# Patient Record
Sex: Male | Born: 1945 | Race: White | Hispanic: No | Smoking: Current every day smoker
Health system: Southern US, Community
[De-identification: ages and names within clinical notes are randomized; demographics above are authoritative.]

## PROBLEM LIST (undated history)

## (undated) DIAGNOSIS — I10 Essential (primary) hypertension: Secondary | ICD-10-CM

## (undated) DIAGNOSIS — I2510 Atherosclerotic heart disease of native coronary artery without angina pectoris: Secondary | ICD-10-CM

## (undated) DIAGNOSIS — E785 Hyperlipidemia, unspecified: Secondary | ICD-10-CM

## (undated) DIAGNOSIS — Z720 Tobacco use: Secondary | ICD-10-CM

## (undated) DIAGNOSIS — N529 Male erectile dysfunction, unspecified: Secondary | ICD-10-CM

## (undated) DIAGNOSIS — M1990 Unspecified osteoarthritis, unspecified site: Secondary | ICD-10-CM

## (undated) HISTORY — DX: Hyperlipidemia, unspecified: E78.5

## (undated) HISTORY — PX: CARDIAC CATHETERIZATION: SHX172

## (undated) HISTORY — DX: Essential (primary) hypertension: I10

## (undated) HISTORY — DX: Male erectile dysfunction, unspecified: N52.9

## (undated) HISTORY — PX: SKIN LESION EXCISION: SHX2412

## (undated) HISTORY — PX: CIRCUMCISION: SUR203

## (undated) HISTORY — DX: Tobacco use: Z72.0

## (undated) HISTORY — DX: Atherosclerotic heart disease of native coronary artery without angina pectoris: I25.10

---

## 2003-01-30 ENCOUNTER — Ambulatory Visit (HOSPITAL_COMMUNITY): Admission: RE | Admit: 2003-01-30 | Discharge: 2003-01-30 | Payer: Self-pay | Admitting: *Deleted

## 2006-05-31 ENCOUNTER — Ambulatory Visit: Payer: Self-pay | Admitting: Cardiology

## 2006-06-01 ENCOUNTER — Inpatient Hospital Stay (HOSPITAL_COMMUNITY): Admission: AD | Admit: 2006-06-01 | Discharge: 2006-06-02 | Payer: Self-pay | Admitting: Cardiology

## 2006-06-16 ENCOUNTER — Ambulatory Visit: Payer: Self-pay | Admitting: Cardiology

## 2006-06-17 ENCOUNTER — Encounter (INDEPENDENT_AMBULATORY_CARE_PROVIDER_SITE_OTHER): Payer: Self-pay | Admitting: *Deleted

## 2006-06-17 LAB — CONVERTED CEMR LAB
Albumin: 4.4 g/dL
CO2: 24 meq/L
Chloride: 103 meq/L
HDL: 41 mg/dL
Sodium: 142 meq/L
Total Protein: 7.4 g/dL

## 2007-01-17 ENCOUNTER — Observation Stay (HOSPITAL_COMMUNITY): Admission: AD | Admit: 2007-01-17 | Discharge: 2007-01-19 | Payer: Self-pay | Admitting: Family Medicine

## 2007-01-17 ENCOUNTER — Ambulatory Visit: Payer: Self-pay | Admitting: Cardiology

## 2007-01-19 ENCOUNTER — Ambulatory Visit: Payer: Self-pay | Admitting: Cardiology

## 2008-07-20 ENCOUNTER — Ambulatory Visit: Payer: Self-pay | Admitting: Internal Medicine

## 2008-07-20 ENCOUNTER — Encounter: Payer: Self-pay | Admitting: Emergency Medicine

## 2008-07-20 ENCOUNTER — Encounter (INDEPENDENT_AMBULATORY_CARE_PROVIDER_SITE_OTHER): Payer: Self-pay | Admitting: *Deleted

## 2008-07-20 ENCOUNTER — Inpatient Hospital Stay (HOSPITAL_COMMUNITY): Admission: AD | Admit: 2008-07-20 | Discharge: 2008-07-23 | Payer: Self-pay | Admitting: Internal Medicine

## 2008-07-22 ENCOUNTER — Encounter: Payer: Self-pay | Admitting: Cardiology

## 2008-08-07 ENCOUNTER — Ambulatory Visit: Payer: Self-pay | Admitting: Cardiology

## 2008-08-21 ENCOUNTER — Ambulatory Visit: Payer: Self-pay | Admitting: Cardiology

## 2008-09-04 ENCOUNTER — Encounter: Payer: Self-pay | Admitting: Cardiology

## 2008-09-04 LAB — CONVERTED CEMR LAB
ALT: 17 units/L
AST: 18 units/L
BUN: 6 mg/dL
CO2: 25 meq/L
Calcium: 8.5 mg/dL
Chloride: 101 meq/L
HCT: 43 %
HDL: 47 mg/dL
Hemoglobin: 14.4 g/dL
Total CK: 87 units/L
Triglyceride fasting, serum: 216 mg/dL

## 2008-10-14 IMAGING — NM NM MYOCAR PERF WALL MOTION
2 series · 12 of 12 positions shown · non-contrast
Comparison: none

CLINICAL DATA: 60-year-old gentleman with a history of coronary disease admitted to the hospital with recurrent chest pain.
 ADENOSINE MYOVIEW STUDY:
 Radionuclide Data:  One day rest/stress protocol performed with [DATE] mCi Nc-44m Myoview.
 Stress Data:  Treadmill exercised to workload of  7 mets and a heart rate of 143, 89% of age-predicted maximum.  Exercise discontinued due to dyspnea and leg fatigue; no chest discomfort reported.  Blood pressure increased from a resting value of 145/75 to 170/80 during exercise and 200/72 early in recovery, a borderline response.  No arrhythmia is noted.  
 EKG:  Normal sinus rhythm; left atrial abnormality; right ventricular conduction delay; prior inferior myocardial infarction; minor nonspecific ST-T wave abnormality.  
 Stress EKG:  During exercise; borderline very slowly upsloping ST segment depression noted late in recovery. 
 Scintigraphic Data:  Acquisition notable for mild to moderate diaphragmatic attenuation.  Left ventricular size was normal.  On tomographic images reconstructed in standard planes, there was a small to moderate sized portion of the basilar inferior and inferolateral segments with moderately to markedly decreased tracer uptake.  By comparison to the resting portion of the study, minimal reversibility was noted.  The gated reconstruction demonstrated moderate to marked hypokinesis at the base of the inferior wall; overall function was low normal with an estimated ejection fraction of .51.  There was decreased systolic accentuation of activity in the basilar inferior segment.

[Series 1: cs cardiac tc hi dose · 6.52mm/px · 6 of 512 frames shown]
[frame 43/512]
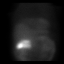
[frame 128/512]
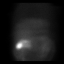
[frame 214/512]
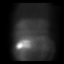
[frame 299/512]
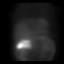
[frame 384/512]
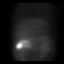
[frame 470/512]
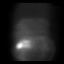

[Series 1: cr cardiac tc low dose · 6.52mm/px · 6 of 64 frames shown]
[frame 6/64]
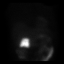
[frame 16/64]
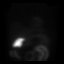
[frame 27/64]
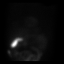
[frame 38/64]
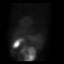
[frame 48/64]
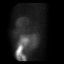
[frame 59/64]
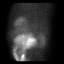

[12 of 12 positions shown; findings below may reference images not displayed]

IMPRESSION: Abnormal stress Myoview examination revealing impaired exercise capacity, and essentially negative stress EKG, no exercise-induced angina, normal left ventricular size and borderline left ventricular systolic function with a segmental wall motion abnormality as described.  By scintigraphic imaging, there was inferior infarction with minimal periinfarction ischemia.  Other findings as noted.

## 2008-11-04 ENCOUNTER — Encounter: Payer: Self-pay | Admitting: Cardiology

## 2008-11-04 DIAGNOSIS — M109 Gout, unspecified: Secondary | ICD-10-CM | POA: Insufficient documentation

## 2008-11-05 ENCOUNTER — Ambulatory Visit: Payer: Self-pay | Admitting: Cardiology

## 2008-11-28 ENCOUNTER — Ambulatory Visit: Payer: Self-pay | Admitting: Cardiology

## 2008-12-23 ENCOUNTER — Encounter: Payer: Self-pay | Admitting: Cardiology

## 2009-03-13 ENCOUNTER — Ambulatory Visit: Payer: Self-pay | Admitting: Cardiology

## 2009-05-16 ENCOUNTER — Encounter (INDEPENDENT_AMBULATORY_CARE_PROVIDER_SITE_OTHER): Payer: Self-pay | Admitting: *Deleted

## 2009-07-08 ENCOUNTER — Encounter (INDEPENDENT_AMBULATORY_CARE_PROVIDER_SITE_OTHER): Payer: Self-pay | Admitting: *Deleted

## 2009-07-21 ENCOUNTER — Ambulatory Visit: Payer: Self-pay | Admitting: Cardiology

## 2009-07-21 ENCOUNTER — Encounter: Payer: Self-pay | Admitting: Cardiology

## 2009-08-19 ENCOUNTER — Ambulatory Visit: Payer: Self-pay | Admitting: Cardiology

## 2009-08-19 ENCOUNTER — Encounter (INDEPENDENT_AMBULATORY_CARE_PROVIDER_SITE_OTHER): Payer: Self-pay | Admitting: *Deleted

## 2009-09-09 ENCOUNTER — Encounter (INDEPENDENT_AMBULATORY_CARE_PROVIDER_SITE_OTHER): Payer: Self-pay | Admitting: *Deleted

## 2009-09-19 ENCOUNTER — Ambulatory Visit: Payer: Self-pay | Admitting: Cardiovascular Disease

## 2009-09-19 ENCOUNTER — Encounter: Payer: Self-pay | Admitting: Cardiology

## 2009-09-19 LAB — CONVERTED CEMR LAB
ALT: 16 units/L (ref 0–53)
AST: 16 units/L (ref 0–37)
Albumin: 4.3 g/dL (ref 3.5–5.2)
Alkaline Phosphatase: 50 units/L (ref 39–117)
LDL Cholesterol: 56 mg/dL (ref 0–99)
Potassium: 4.4 meq/L (ref 3.5–5.3)
Sodium: 139 meq/L (ref 135–145)
Total Protein: 6.6 g/dL (ref 6.0–8.3)

## 2009-09-23 ENCOUNTER — Encounter (INDEPENDENT_AMBULATORY_CARE_PROVIDER_SITE_OTHER): Payer: Self-pay | Admitting: *Deleted

## 2009-10-21 ENCOUNTER — Ambulatory Visit: Payer: Self-pay | Admitting: Cardiology

## 2009-10-27 ENCOUNTER — Encounter: Payer: Self-pay | Admitting: Cardiology

## 2009-10-27 LAB — CONVERTED CEMR LAB
CO2: 23 meq/L (ref 19–32)
Calcium: 9.5 mg/dL (ref 8.4–10.5)
Chloride: 102 meq/L (ref 96–112)
Sodium: 137 meq/L (ref 135–145)

## 2010-01-26 ENCOUNTER — Telehealth (INDEPENDENT_AMBULATORY_CARE_PROVIDER_SITE_OTHER): Payer: Self-pay | Admitting: *Deleted

## 2010-04-15 IMAGING — CR DG CHEST 1V PORT
1 series · 1 of 1 positions shown · non-contrast
Comparison: 01/18/2007

CLINICAL DATA: Chest pain

PORTABLE CHEST - 1 VIEW

[view not recorded]
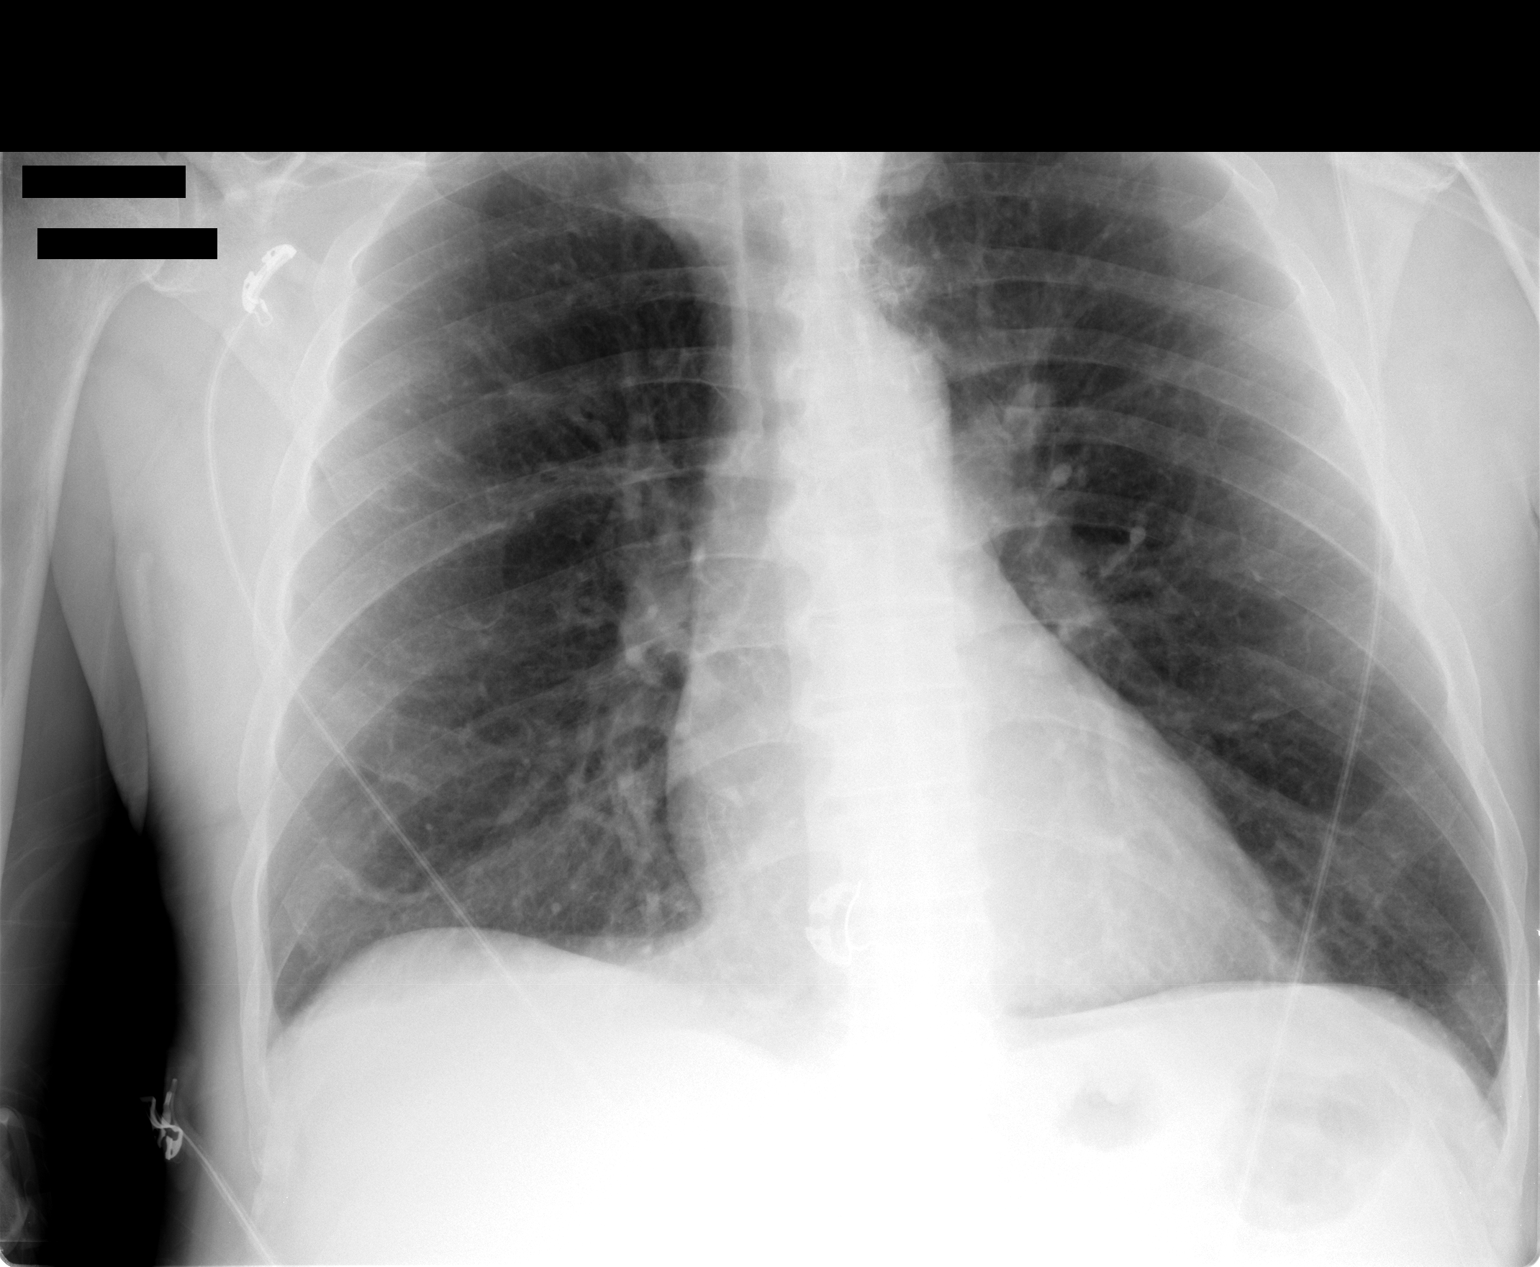

[1 of 1 positions shown; findings below may reference images not displayed]

FINDINGS: Cardiomediastinal silhouette is within normal limits. The
lungs are clear. No pleural effusion.  No pneumothorax.  No acute
osseous abnormality.
IMPRESSION: No acute cardiopulmonary process.

## 2010-07-14 NOTE — Progress Notes (Signed)
Summary: pt wants to know about Zocor  Phone Note Call from Patient Call back at Home Phone [phone]   Caller: pt Reason for Call: Talk to Nurse Summary of Call: Pt is calling to find what Zocor is used for and if they could get it called in if it would be cheeper then what meds he is on now. Initial call taken by: Faythe Ghee,  January 26, 2010 1:45 PM  Follow-up for Phone Call        there is no generic equilavent for the amount of lipitor Mr. Winegarden is on, he is on an 80mg  dose Follow-up by: Teressa Lower RN,  January 28, 2010 10:02 AM

## 2010-07-14 NOTE — Assessment & Plan Note (Signed)
Summary: E4V   Visit Type:  Follow-up Primary Provider:  Dr. Butch Penny   History of Present Illness: Mr. Allard Lightsey returns to the office as scheduled for continued assessment and treatment of coronary disease and cardiovascular risk factors.  Since his last visit, he has done quite well.  He is working full-time in a [employer] in a fairly physically demanding job without any cardiopulmonary symptoms.  Unfortunately, he continues to smoke cigarettes, albeit at a reduced rate of one half pack per day.  He quit Chantix after 2 weeks as a result of adverse GI effects.  He has not had a recent assessment of cholesterol.  Blood pressure has been good as far as he knows.  He experiences sinus congestion, which causes a vague sense of dyspnea.  He has never been hospitalized for chronic lung disease.   Current Medications (verified): 1)  Lipitor 80 Mg Tabs (Atorvastatin Calcium) .... Take One Tablet By Mouth Daily. 2)  Aspirin 81 Mg Tbec (Aspirin) .... Take One Tablet By Mouth Daily 3)  Plavix 75 Mg Tabs (Clopidogrel Bisulfate) .... Take One Tablet By Mouth Daily  Allergies (verified): No Known Drug Allergies  Past History:  PMH, FH, and Social History reviewed and updated.  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    Vital Signs:  Patient profile:    65 year old male Weight:      155 pounds BMI:     22.97 Pulse rate:   78 / minute BP sitting:   156 / 76  (right arm)  Vitals Entered By: Dreama Saa, CNA (August 19, 2009 11:45 AM)  Physical Exam  General:   Proportionate weight and height; well developed; no acute distress:   Neck-No JVD; no carotid bruits: Lungs-No tachypnea, no rales; no rhonchi; no wheezes; some prolongation of the expiratory phase Cardiovascular-normal PMI; normal S1 and S2; S4 present Abdomen-BS normal; soft and non-tender without masses or organomegaly:  Musculoskeletal-No deformities, no cyanosis or clubbing: Neurologic-Normal cranial nerves; symmetric strength and tone:  Skin-Warm, no significant lesions: Extremities-Nl distal pulses; trace edema:     Impression & Recommendations:  Problem # 1:  HYPERLIPIDEMIA (ICD-272.4) Lipid profile was excellent one year ago.  A repeat lipid profile and complete metabolic profile will be obtained.  Problem # 2:  TOBACCO ABUSE (ICD-305.1) Patient is committed to attempting to taper tobacco consumption, but is not interested in an alternative pharmacologic agent.  I encouraged him to continue to work on attempts to abstain from tobacco use entirely.  Problem # 3:  ATHEROSCLEROTIC CARDIOVASCULAR DISEASE (ICD-429.2) No symptoms to suggest myocardial ischemia.  Plavix has been used for one year following drug-eluting stent placement.  We will continue that agent for at least one more year.  I will reassess this nice gentleman in 12 months.  Other Orders: Future Orders: T-Lipid Profile ([id number]) ... 09/19/2009 T-Comprehensive Metabolic Panel [id number]) ... 09/19/2009  Patient Instructions: 1)  Your physician recommends that you schedule a follow-up appointment in: 1 year  2)  Your physician recommends that you return for lab work in: 1 month

## 2010-07-14 NOTE — Letter (Signed)
Summary: [place] [facility] Work Engineer, agricultural at [employer]  [address] [place] [address]   Phone: [phone]  Fax: [phone]     August 19, 2009 MRN: [id number]   [facility] [address] [place], [place]  [address]      YOUR LAB WORK IS DUE   _____________APRIL 8, 2011____________________________  Please go to [facility], located across the street from [facility] on the second floor.  Hours are Monday - Friday 7am until 7:30pm         Saturday 8am until 12noon    _X_  DO NOT EAT OR DRINK AFTER MIDNIGHT EVENING PRIOR TO LABWORK  __ YOUR LABWORK IS NOT FASTING --YOU MAY EAT PRIOR TO LABWORK

## 2010-07-14 NOTE — Miscellaneous (Signed)
  Clinical Lists Changes  Observations: Added new observation of RS STUDY: TRACER - study completion 07/22/09 (09/09/2009 12:09)      Research Study Name: TRACER - study completion 07/22/09

## 2010-07-14 NOTE — Miscellaneous (Signed)
Summary: LABS TSH 07/20/2008 CMP,LIPIDS 06/17/2006  Clinical Lists Changes  Observations: Added new observation of TSH: 0.653 microintl units/mL (07/20/2008 11:19)

## 2010-07-14 NOTE — Assessment & Plan Note (Signed)
Summary: 1 MTH NURSE VISIT PER CHECKOUT ON 08/19/09/TG  Nurse Visit   Vital Signs:  Patient profile:   65 year old male Height:      69 inches Weight:      156 pounds O2 Sat:      98 % on Room air Pulse rate:   73 / minute BP sitting:   161 / 92  (right arm)  Vitals Entered By: Teressa Lower RN (September 19, 2009 9:55 AM)  O2 Flow:  Room air  Visit Type:  1 month nurse visit Primary Provider:  Dr. Butch Penny   History of Present Illness: S:1 month nurse visit B: pt did not do a bp diary A, stated he would check it but no write it down, average per pt was 140/80, high he seen was 157/82, does not take any bp medication R  asked pt to take his bp daily over the weekend and bring reading back on Monday  Dr. Molly Maduro Rothbart-add lisinopril/HCT 20/12.5 mg q.d.;                                  Bmet in one month                                  RN blood pressure visit in one month.    Current Medications (verified): 1)  Lipitor 80 Mg Tabs (Atorvastatin Calcium) .... Take One Tablet By Mouth Daily. 2)  Aspirin 81 Mg Tbec (Aspirin) .... Take One Tablet By Mouth Daily 3)  Plavix 75 Mg Tabs (Clopidogrel Bisulfate) .... Take One Tablet By Mouth Daily  Allergies (verified): No Known Drug Allergies  Orders Added: 1)  T-Basic Metabolic Panel [id number] Prescriptions: LISINOPRIL-HYDROCHLOROTHIAZIDE 20-12.5 MG TABS (LISINOPRIL-HYDROCHLOROTHIAZIDE) Take 1 tablet by mouth once a day  #30 x 3   Entered by:   Teressa Lower RN   Authorized by:   Kathlen Brunswick, MD, [facility]   Signed by:   Teressa Lower RN on 09/23/2009   Method used:   Electronically to        [facility] (retail)       [address]       [place], [place]  [address]       Ph: [phone]       Fax: [phone]   RxID:   [id number]

## 2010-07-14 NOTE — Letter (Signed)
Summary: bp readings  bp readings   Imported By: Faythe Ghee 10/21/2009 16:01:02  _____________________________________________________________________  External Attachment:    Type:   Image     Comment:   External Document

## 2010-07-14 NOTE — Letter (Signed)
Summary: [place] [facility] Work Engineer, agricultural at [employer]  [address] [place] [address]   Phone: [phone]  Fax: [phone]     Oct 27, 2009 MRN: [id number]   [name] [address] [place] [address] [place]  [address]      YOUR LAB WORK IS DUE  November 17, 2009 _________________________________________  Please go to [facility], located across the street from [facility] on the second floor.  Hours are Monday - Friday 7am until 7:30pm         Saturday 8am until 12noon    __  DO NOT EAT OR DRINK AFTER MIDNIGHT EVENING PRIOR TO LABWORK  _X_ YOUR LABWORK IS NOT FASTING --YOU MAY EAT PRIOR TO LABWORK

## 2010-07-14 NOTE — Letter (Signed)
Summary: Handout Printed  Printed Handout:  - Diet - Potassium Content of Foods  Appended Document: Orders Update: BMET    Clinical Lists Changes  Orders: Added new Test order of T-Basic Metabolic Panel [id number]) - Signed

## 2010-07-14 NOTE — Assessment & Plan Note (Signed)
Summary: 1 mth nurse visit per Tammy/tg  Nurse Visit   Vital Signs:  Patient profile:   65 year old male Weight:      154 pounds O2 Sat:      97 % Pulse rate:   78 / minute BP sitting:   150 / 81  (left arm)  Vitals Entered ByLarita Fife Via LPN (Oct 21, 2009 10:47 AM)  Current Medications (verified): 1)  Lipitor 80 Mg Tabs (Atorvastatin Calcium) .... Take One Tablet By Mouth Daily. 2)  Aspirin 81 Mg Tbec (Aspirin) .... Take One Tablet By Mouth Daily 3)  Plavix 75 Mg Tabs (Clopidogrel Bisulfate) .... Take One Tablet By Mouth Daily 4)  Lisinopril-Hydrochlorothiazide 20-12.5 Mg Tabs (Lisinopril-Hydrochlorothiazide) .... Take 1 Tablet By Mouth Once A Day  Allergies (verified): No Known Drug Allergies  Visit Type:  BP check/Nurse visit Primary Provider:  Dr. Butch Penny   History of Present Illness: S: Pt. returns to office for BP check.  B: Pt. was seen on 09-19-09 for a BP check. BP at that visit was 161/92.  Dr. Marvel Plan recommendations were to start Lisinopril/HCT 20/12.5mg  once daily , have BMET in 1 month and BP/nurse visit.  A: Pt. brought in list of BP readings that he took on himself when he could, scanned into chart. He did not write date or time of each reading on list. Out of 9 readings his SBP Avg.= 123 and the DBP Avg.=64. BP this morning is higher  @ 150/81. He reports no complaints at this time. Was given lab order to have BMET drawn this morning. R: Will call pt. with Dr. Langston Masker recommendations, if any, from this BP check.         10/23/09  Continue current meds; F/U as planned.   [name], M.D.                                                                                                                                                                           Patient advised.         Larita Fife Via LPN  Oct 23, 2009 1:31 PM

## 2010-07-14 NOTE — Miscellaneous (Signed)
Summary: LABS CMP,LIPIDS 06/17/2006  Clinical Lists Changes  Observations: Added new observation of CALCIUM: 9.5 mg/dL (16/03/9603 54:09) Added new observation of ALBUMIN: 4.4 g/dL (81/19/1478 29:56) Added new observation of PROTEIN, TOT: 7.4 g/dL (21/30/8657 84:69) Added new observation of SGPT (ALT): 63 units/L (06/17/2006 11:54) Added new observation of SGOT (AST): 33 units/L (06/17/2006 11:54) Added new observation of ALK PHOS: 52 units/L (06/17/2006 11:54) Added new observation of CREATININE: 0.81 mg/dL (62/95/2841 32:44) Added new observation of BUN: 11 mg/dL (06/16/7251 66:44) Added new observation of BG RANDOM: 93 mg/dL (03/47/4259 56:38) Added new observation of CO2 PLSM/SER: 24 meq/L (06/17/2006 11:54) Added new observation of CL SERUM: 103 meq/L (06/17/2006 11:54) Added new observation of K SERUM: 5.0 meq/L (06/17/2006 11:54) Added new observation of NA: 142 meq/L (06/17/2006 11:54) Added new observation of HDL: 41 mg/dL (75/64/3329 51:88) Added new observation of TRIGLYC TOT: 454 mg/dL (41/66/0630 16:01) Added new observation of CHOLESTEROL: 224 mg/dL (09/32/3557 32:20)

## 2010-07-14 NOTE — Letter (Signed)
Summary: [place] Results Engineer, agricultural at [facility]  [address] [place] [address]   Phone: [phone]  Fax: [phone]      September 23, 2009 MRN: [id number]   MARTINEZ BOXX [address] [place] [address], [place]  [address]   Dear Mr. Kundrat,  Your test ordered by Selena Batten has been reviewed by your physician (or physician assistant) and was found to be normal or stable. Your physician (or physician assistant) felt no changes were needed at this time.  ____ Echocardiogram  ____ Cardiac Stress Test  __x__ Lab Work  ____ Peripheral vascular study of arms, legs or neck  ____ CT scan or X-ray  ____ Lung or Breathing test  ____ Other:  Please start lisinopril/hct 20/12.5mg  daily, labwork in 1 month. Nurse visit for bp check and please bring  a list of your medications and your bp dairy, per Dr. Dietrich Pates.  Thank you, Shaylan Tutton Allyne Gee RN    Hastings [name], MD, [facility] Arena.C.Gaylord Shih, MD, F.A.C.C Lewayne Bunting, MD, F.A.C.C Nona Dell, MD, F.A.C.C Charlton Haws, MD, [facility] Arena.C.C

## 2010-09-28 ENCOUNTER — Encounter: Payer: Self-pay | Admitting: *Deleted

## 2010-09-28 ENCOUNTER — Encounter: Payer: Self-pay | Admitting: Cardiology

## 2010-09-28 ENCOUNTER — Ambulatory Visit (INDEPENDENT_AMBULATORY_CARE_PROVIDER_SITE_OTHER): Payer: BC Managed Care – PPO | Admitting: Cardiology

## 2010-09-28 VITALS — BP 108/65 | HR 83 | Ht 69.0 in | Wt 148.0 lb

## 2010-09-28 DIAGNOSIS — I2510 Atherosclerotic heart disease of native coronary artery without angina pectoris: Secondary | ICD-10-CM

## 2010-09-28 DIAGNOSIS — E785 Hyperlipidemia, unspecified: Secondary | ICD-10-CM

## 2010-09-28 DIAGNOSIS — N529 Male erectile dysfunction, unspecified: Secondary | ICD-10-CM | POA: Insufficient documentation

## 2010-09-28 DIAGNOSIS — F17200 Nicotine dependence, unspecified, uncomplicated: Secondary | ICD-10-CM

## 2010-09-28 MED ORDER — VARENICLINE TARTRATE 1 MG PO TABS: 1.0000 mg | ORAL_TABLET | Freq: Two times a day (BID) | ORAL | Status: DC

## 2010-09-28 MED ORDER — SILDENAFIL CITRATE 50 MG PO TABS: 50.0000 mg | ORAL_TABLET | Freq: Every day | ORAL | Status: DC | PRN

## 2010-09-28 MED ORDER — VARENICLINE TARTRATE 0.5 MG X 11 & 1 MG X 42 PO MISC: ORAL | Status: DC

## 2010-09-28 NOTE — Assessment & Plan Note (Signed)
Lipid profile one year ago was good except for elevated triglycerides.  A repeat study will be obtained.

## 2010-09-28 NOTE — Assessment & Plan Note (Signed)
Teaching was provided regarding the use of PDE inhibitors and a prescription for Viagra provided.

## 2010-09-28 NOTE — Progress Notes (Signed)
HPI : [name] returns to the office for continued assessment and treatment of coronary disease and cardiovascular risk factors.  He has done well over the past year with no chest discomfort, dyspnea, orthopnea, PND or syncope.  He continues to work full-time, noting more difficulty completing a 12 hour shift during which he is standing much of the time, but generally tolerating the work without any significant problem.  He has developed erectile dysfunction and requests a trial of Viagra.  He continues to smoke cigarettes, but has resolved to discontinue tobacco consumption.  Current Outpatient Prescriptions on File Prior to Visit  Medication Sig Dispense Refill  . aspirin 81 MG tablet Take 81 mg by mouth daily.        Marland Kitchen atorvastatin (LIPITOR) 80 MG tablet Take 80 mg by mouth daily.        . clopidogrel (PLAVIX) 75 MG tablet Take 75 mg by mouth daily.        Marland Kitchen lisinopril-hydrochlorothiazide (PRINZIDE,ZESTORETIC) 20-12.5 MG per tablet Take 1 tablet by mouth daily.           No Known Allergies    Past medical history, social history, and family history reviewed and updated.  ROS: Denies orthopnea, PND, lightheadedness, syncope and pedal edema.  PHYSICAL EXAM: BP 108/65  Pulse 83  Ht 5\' 9"  (1.753 m)  Wt 148 lb (67.132 kg)  BMI 21.86 kg/m2  SpO2 98%  General-Well developed; no acute distress Body habitus-proportionate weight and height Neck-No JVD; no carotid bruits Lungs-clear lung fields; resonant to percussion; mildly prolonged expiratory phase Cardiovascular-distant PMI; normal S1 and S2; modest systolic ejection murmur Abdomen-normal bowel sounds; soft and non-tender without masses or organomegaly Musculoskeletal-No deformities, no cyanosis or clubbing Neurologic-Normal cranial nerves; symmetric strength and tone Skin-Warm, no significant lesions Extremities-distal pulses intact; no edema  ASSESSMENT AND PLAN:

## 2010-09-28 NOTE — Assessment & Plan Note (Signed)
Patient was strongly encouraged to discontinue cigarette smoking.  Treatment with the Chantix will be initiated.

## 2010-09-28 NOTE — Assessment & Plan Note (Signed)
No clinical evidence for regression of coronary disease.  Our focus will remain the optimal control of risk factors.  It has been close to 2 years since a drug-eluting stent was placed for restenosis in a BMS.  Since the original procedure was performed for acute MI, a case could be made for lifelong treatment with a P2Y12 receptor blocker; however, I would be inclined to discontinue clopidogrel at his next visit.

## 2010-09-28 NOTE — Patient Instructions (Signed)
Your physician recommends that you schedule a follow-up appointment in: 1 YEAR Your physician recommends that you return for lab work in: [employer] Your physician has recommended you make the following change in your medication: ALEVE 1-2 TABLETS THREE TIMES DAILY AS NEEDED FOR PAIN, CHANTIX STARTER PAK FOLLOW DIRECTIONS, VIAGRA 1 TABLET DAILY AS NEEDED FOR SEXUAL ACTIVITY

## 2010-09-29 LAB — CBC
HCT: 38.7 % — ABNORMAL LOW (ref 39.0–52.0)
Hemoglobin: 15.7 g/dL (ref 13.0–17.0)
Hemoglobin: 13.5 g/dL (ref 13.0–17.0)
Hemoglobin: 13.8 g/dL (ref 13.0–17.0)
MCHC: 34.3 g/dL (ref 30.0–36.0)
MCHC: 34.9 g/dL (ref 30.0–36.0)
RBC: 4.81 MIL/uL (ref 4.22–5.81)
RBC: 4.12 MIL/uL — ABNORMAL LOW (ref 4.22–5.81)
RDW: 13.7 % (ref 11.5–15.5)
RDW: 13.8 % (ref 11.5–15.5)
WBC: 5.3 10*3/uL (ref 4.0–10.5)

## 2010-09-29 LAB — BASIC METABOLIC PANEL
CO2: 26 mEq/L (ref 19–32)
CO2: 25 mEq/L (ref 19–32)
Calcium: 8.9 mg/dL (ref 8.4–10.5)
Chloride: 101 mEq/L (ref 96–112)
GFR calc Af Amer: >60 mL/min (ref >60)
GFR calc Af Amer: >60 mL/min (ref >60)
GFR calc non Af Amer: >60 mL/min (ref >60)
Potassium: 3.5 mEq/L (ref 3.5–5.1)
Sodium: 139 mEq/L (ref 135–145)
Sodium: 134 mEq/L — ABNORMAL LOW (ref 135–145)

## 2010-09-29 LAB — DIFFERENTIAL
Lymphocytes Relative: 21 % (ref 12–46)
Lymphs Abs: 1.1 10*3/uL (ref 0.7–4.0)
Monocytes Absolute: 0.4 10*3/uL (ref 0.1–1.0)
Monocytes Relative: 8 % (ref 3–12)
Neutro Abs: 3.5 10*3/uL (ref 1.7–7.7)

## 2010-09-29 LAB — CARDIAC PANEL(CRET KIN+CKTOT+MB+TROPI)
CK, MB: 11.1 ng/mL — ABNORMAL HIGH (ref 0.3–4.0)
CK, MB: 13.1 ng/mL — ABNORMAL HIGH (ref 0.3–4.0)
Relative Index: 1.9 (ref 0.0–2.5)
Relative Index: INVALID (ref 0.0–2.5)
Total CK: 156 U/L (ref 7–232)
Total CK: 167 U/L (ref 7–232)
Troponin I: 0.39 ng/mL — ABNORMAL HIGH (ref 0.00–0.06)
Troponin I: 1.69 ng/mL (ref 0.00–0.06)

## 2010-09-29 LAB — POCT CARDIAC MARKERS
CKMB, poc: 1.3 ng/mL (ref 1.0–8.0)
Myoglobin, poc: 72.5 ng/mL (ref 12–200)
Troponin i, poc: <0.05 ng/mL (ref 0.00–0.09)

## 2010-09-29 LAB — HEPARIN LEVEL (UNFRACTIONATED)
Heparin Unfractionated: 0.2 IU/mL — ABNORMAL LOW (ref 0.30–0.70)
Heparin Unfractionated: 0.33 IU/mL (ref 0.30–0.70)
Heparin Unfractionated: 0.35 IU/mL (ref 0.30–0.70)

## 2010-09-29 LAB — LIPID PANEL
Cholesterol: 174 mg/dL (ref 0–200)
LDL Cholesterol: 75 mg/dL (ref 0–99)
VLDL: 69 mg/dL — ABNORMAL HIGH (ref 0–40)

## 2010-10-19 ENCOUNTER — Other Ambulatory Visit: Payer: Self-pay | Admitting: Cardiology

## 2010-10-20 LAB — LIPID PANEL
Cholesterol: 142 mg/dL (ref 0–200)
HDL: 45 mg/dL (ref >39)
LDL Cholesterol: 81 mg/dL (ref 0–99)
Total CHOL/HDL Ratio: 3.2 Ratio
Triglycerides: 78 mg/dL (ref <150)
VLDL: 16 mg/dL (ref 0–40)

## 2010-10-22 ENCOUNTER — Encounter: Payer: Self-pay | Admitting: *Deleted

## 2010-10-22 NOTE — Progress Notes (Signed)
Result letter to pt.

## 2010-10-27 NOTE — Letter (Signed)
August 07, 2008    Angus G. Renard Matter, MD  [address]  [place], [place] [address]   RE:  ASTER, ECKRICH  MRN:  [id number]  /  DOB:  1946/01/08   Dear Thalia Party,   Mr. Heupel returns to the office following a recent admission to [facility] with acute myocardial infarction, prompting urgent  revascularization.  He was found to have an in-stent restenosis in the  right coronary, progressive distal right coronary disease, and chronic  diffuse circumflex disease that was not amenable to intervention.  A  drug-eluting stent was placed in the right coronary.  He did well  clinically and has felt fine since discharge.   CURRENT MEDICATIONS:  1. Atorvastatin 80 mg daily.  2. Aspirin 81 mg daily.  3. Clopidogrel 75 mg daily.  4. Chantix 1 mg daily.  5. Study drug, which is either an antithrombin agent or a placebo.   PHYSICAL EXAMINATION:  GENERAL:  A trim pleasant gentleman in no acute  distress.  VITAL SIGNS:  The weight is 151, 5 pounds less than in 2008.  Blood  pressure 135/70, heart rate 80 and regular, respirations 14 and  unlabored.  NECK:  No jugular venous distention; normal carotid upstrokes without  bruits.  LUNGS:  Clear.  CARDIAC:  Normal first and second heart sounds.  Fourth heart sound  present.  ABDOMEN:  Soft and nontender; no organomegaly.  EXTREMITIES:  No edema; benign catheterization site.   EKG:  Normal sinus rhythm; right ventricular conduction delay; the  inferior myocardial infarction of indeterminate age, more prominent than  on a previous tracing of June 16, 2006.   IMPRESSION:  Mr. Klemens has suffered another myocardial infarction in  the setting of continued cigarette smoking.  I have given him a  prescription for Chantix.  He seems motivated to at least try to  discontinue tobacco use.  He will start fish oil 1 capsule b.i.d.  The  importance of uninterrupted treatment with Plavix was emphasized.  I  provided him with a note to allow  him to return to work and will plan to  see him again in 3 months.    Sincerely,      Gerrit Friends. Dietrich Pates, MD, [facility]  Electronically Signed    RMR/MedQ  DD: 08/07/2008  DT: 08/08/2008  Job #: [id number]

## 2010-10-27 NOTE — Letter (Signed)
Nov 05, 2008    Nicholas G. Renard Matter, MD  [address]  [place], [place] [address]   RE:  [name]  MRN:  [id number]  /  DOB:  11-10-1945   Dear [name],   [name] returns to the office for continued assessment and treatment  of coronary disease following myocardial infarction in 2008 and 2010.  He has done well over the past 3 months with no cardiopulmonary  symptoms.  He is working full time at the [employer] in [place]  without difficulty.  His job is not very physically demanding.  His  lifestyle is sedentary.   He continues to take his medications, which are unchanged.  He has used  Chantix for the past 3 months resulting in some diminution of cigarette  smoking.  He is currently down to 1 pack per day.   PHYSICAL EXAMINATION:  GENERAL:  Pleasant trim gentleman in no acute  distress.  VITAL SIGNS:  The weight is 152, stable.  Blood pressure 120/75, heart  rate 70 and regular, respirations 12 and unlabored.  NECK:  No jugular venous distention; normal carotid upstrokes without  bruits.  LUNGS:  Clear.  CARDIAC:  Normal first and second heart sounds; modest systolic ejection  murmur.  ABDOMEN:  Soft and nontender; no bruits; no organomegaly.  EXTREMITIES:  No edema; distal pulses intact.   IMPRESSION:  [name] is doing well overall.  A lipid profile in March  was excellent.  He is congratulated on decreasing cigarette use, but  advised to attempt to quit entirely during his next  and last 3 months of Chantix therapy.  He will continue to be followed  by our study nurses in conjunction with his experimental medication,  which blocks the interaction between thrombin and platelets preventing  platelet activation.  I will plan to see this nice gentleman again in 6  months.    Sincerely,      Gerrit Friends. Dietrich Pates, MD, [facility]  Electronically Signed    RMR/MedQ  DD: 11/05/2008  DT: 11/06/2008  Job #: [id number]

## 2010-10-27 NOTE — Discharge Summary (Signed)
[name], [name]                 ACCOUNT NO.:  [id number]   MEDICAL RECORD NO.:  [id number]          PATIENT TYPE:  INP   LOCATION:  2508                         FACILITY:  [facility]   PHYSICIAN:  Nicholas R. Juanda Chance, MD, FACCDATE OF BIRTH:  1945/12/01   DATE OF ADMISSION:  07/20/2008  DATE OF DISCHARGE:  07/23/2008                               DISCHARGE SUMMARY   PRIMARY CARDIOLOGIST:  Nicholas Friends. Dietrich Pates, MD, [facility]   INTERVENTIONAL CARDIOLOGIST:  Nicholas Beals Juanda Chance, MD, [name]   PRIMARY CARE PHYSICIAN:  Nicholas G. McInnis, MD   PROCEDURES PERFORMED DURING HOSPITALIZATION:  1. Cardiac catheterization completed on July 22, 2008.      a.     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      b.     Successful PCI and intervention of the in-stent restenosis       in the mid right coronary artery using a cutting balloon       angioplasty with improvement from 90% to 10%.      c.     Successful PCI of a lesion in the distal right coronary       artery using Xience drug-eluting stent with improvement from 80%       to 0%.   FINAL DISCHARGE DIAGNOSES:  1. Coronary artery disease.      a.     Multivessel coronary artery disease to include the right       coronary artery, circumflex, nonobstructive left anterior       descending lesions.      b.     Status post percutaneous coronary intervention of the right       coronary artery using Xience drug-eluting stent.      c.     Percutaneous coronary intervention of an in-stent  restenotic       lesion in the right coronary artery using cutting balloon       angioplasty.      d.     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  2. Hypercholesterolemia.  3. Dyslipidemia.  4. Ongoing tobacco.  5. History of gout.   HOSPITAL COURSE:  A 65 year old male with known history as stated above  who presented to [facility] ED for complaints of recurrent chest  discomfort.  The patient had admission of noncompliance with medication  over the past 3 years.  The patient also  reported noncompliance with  Plavix.  The patient had complaints of left-sided chest discomfort  described as heaviness.  The patient took aspirin and was brought to the  emergency room where he was found to be hypertensive with a blood  pressure of 170/89 and a pulse of 60.  He was treated with baby aspirin,  given Plavix, nitroglycerin paste, and Lovenox and transferred to [facility] for further evaluation and cardiac catheterization.  The patient in  the interim was placed on heparin, Plavix, aspirin, and scheduled for  cardiac catheterization.   The patient did undergo cardiac catheterization as described on July 22, 2008, with intervention as described.  Please see Dr. Regino [name]  thorough cardiac catheterization note for more details.  After  procedure, the patient was re-enrolled in the TRACER drug study and was  seen by research and provided with this medication.   The patient was seen and examined by Dr. Charlies [name] on day of  discharge and found to be stable.  The patient will return home on same  medications as he was taking prior to admission with the exception of  addition of TRACER study drug, which was provided to him by research.  The patient will follow up with Dr. Dietrich [name] in [place] on August 07, 2008.   DISCHARGE LABORATORIES:  Sodium 134, potassium 3.5, chloride 101, CO2 of  25, glucose 145, BUN 6, creatinine 0.95.  Cardiac enzymes,  troponin  initially less than 0.05, then 1.75, 1.69, 0.39, 0.52.  Hemoglobin 13.8,  hematocrit 40.3, white blood cells 5.9, platelets 134.  Cholesterol 174,  lipids 345, HDL 30, LDL 75.  Chest x-ray dated July 20, 2008,  revealing no acute cardiopulmonary process.  EKG on discharge, normal  sinus rhythm with incomplete right bundle-branch block with evidence of  anterior infarct, ventricular rate of 68 beats per minute.   DISCHARGE MEDICATIONS:  1. TRACER study drug.  2. Lipitor 80 mg daily.  3. Aspirin 81 mg daily.  4. Plavix 75 mg daily.  5. Nitroglycerin 0.4 mg under tongue p.r.n. chest pain.   ALLERGIES:  No known drug allergies.   FOLLOWUP PLANS AND APPOINTMENTS:  1. The patient will follow up with Dr. Gonzales [name] on August 07, 2008, at 2:45 p.m.  2. The patient will follow up with his primary care physician, Dr.      Renard [name] for continued medical management.  3. The patient has been given post cardiac catheterization      instructions with particular emphasis on the right groin site for      evidence of bleeding, hematoma, signs of infection.  4. The patient has been advised to bring all medications to follow up      appointments.   Time spent with the patient to include physician time 35 minutes.      Nicholas Mare. Lyman Bishop, NP      Nicholas Beals. Juanda Chance, MD, [facility]  Electronically Signed    [name]/MEDQ  D:  07/23/2008  T:  07/23/2008  Job:  [id number]   cc:   Nicholas G. Renard Matter, MD

## 2010-10-27 NOTE — Group Therapy Note (Signed)
NAMETREY, GULBRANSON                 ACCOUNT NO.:  [id number]   MEDICAL RECORD NO.:  [id number]          PATIENT TYPE:  INP   LOCATION:  A225                          FACILITY:  [facility]   PHYSICIAN:  Angus G. Renard Matter, MD   DATE OF BIRTH:  [birth year]   DATE OF PROCEDURE:  DATE OF DISCHARGE:                                 PROGRESS NOTE   This patient does have known coronary artery disease with previous MI.  He was admitted with history of chest pain earlier in the day.   His cardiac panel shows CK 98, CK-MB 1.9, troponin 0.02.  Cardiac  markers appear stable.  The patient has had no further chest pain.  HEART:  Regular rhythm.  LUNGS:  Clear to P&A.  ABDOMEN:  No palpable organs or masses.   ASSESSMENT:  The patient has known coronary artery disease with previous  myocardial infarction and previous stent placement.   PLAN:  To obtain cardiology consult today.  Continue current regimen.  The patient may be candidate for further workup with a 2-D echo, stress  testing, or cardiac catheterization.  Will be seen by cardiology today.      Angus G. Renard Matter, MD  Electronically Signed     AGM/MEDQ  D:  01/18/2007  T:  01/18/2007  Job:  [id number]

## 2010-10-27 NOTE — H&P (Signed)
NAMEOSHEN, [name]                 ACCOUNT NO.:  [id number]   MEDICAL RECORD NO.:  [id number]          PATIENT TYPE:  INP   LOCATION:  A225                          FACILITY:  [facility]   PHYSICIAN:  Angus G. McInnis, MD   DATE OF BIRTH:  14-Feb-1946   DATE OF ADMISSION:  01/17/2007  DATE OF DISCHARGE:  LH                              HISTORY & PHYSICAL   HISTORY OF PRESENT ILLNESS:  A 65 year old white male with a prior  history of coronary artery disease and stent placement was seen in the  office with symptoms related to upper respiratory passages, nasal  congestion, postnasal drainage.  Also relates left sided chest pain.  Pain was present mostly this morning, dull type pain that radiated to  his left shoulder.  No diaphoresis.  Pain has improved some this  afternoon, and electrocardiogram done in the office did not show acute  changes.  Only possible abnormalities showed peaked T-waves over the  precordial leads.  I discussed the situation with Dr. Dietrich Pates, his  cardiologist, and he advised that we admit the patient to rule out  possibility of ischemic heart disease.  The patient continues to smoke  approximately half pack of cigarettes daily.   SOCIAL HISTORY:  The patient not drink alcohol but smokes approximately  half a pack of cigarettes daily.   FAMILY HISTORY:  See previous record.   PAST MEDICAL AND SURGICAL HISTORY:  The patient had an inferior  myocardial infarction in December with one stent placement.  He does  have a history of hyperuricemia and prior history of gout.  Does have a  history of dyslipidemia.  No prior surgery.   ALLERGIES:  No no known allergies.   MEDICATION LIST:  1. Lipitor 80 mg daily.  2. Aspirin 325 mg daily.  3. Clopidogrel 75 mg daily,  4. Carvedilol 6.25 mg b.i.d.   REVIEW OF SYSTEMS:  HEENT: Negative.  CARDIOPULMONARY:  The patient has  left chest pain, slight cough and congestion.  GI:  No bowel  irregularity or bleeding.  GU: No  dysuria, hematuria.   PHYSICAL EXAMINATION:  GENERAL:  Alert white male.  VITAL SIGNS:  Blood pressure 130/80, pulse 60, respirations 18,  temperature 98.  HEENT: Eyes: PERRLA.  TMs never negative, minimal congestion and nasal  passages.  NECK:  Supple.  No JVD or thyroid abnormalities.  HEART:  Regular rhythm no murmurs.  No cardiomegaly.  LUNGS:  Clear to P&A.  ABDOMEN:  No palpable organs or masses.  No organomegaly.  EXTREMITIES:  Free of edema.  GENITALIA:  Normal.   DIAGNOSIS:  1. Left chest pain.  2. History of previous coronary artery disease with stent placement      rule out of early infarction.   PLAN:  Admit to 2 ACCNT.  The patient will be seen by Cardiology with  possible echocardiogram or cardiac catheterization.      Angus G. Renard Matter, MD  Electronically Signed     AGM/MEDQ  D:  01/17/2007  T:  01/18/2007  Job:  [id number]

## 2010-10-27 NOTE — Cardiovascular Report (Signed)
[name], [name]                 ACCOUNT NO.:  [id number]   MEDICAL RECORD NO.:  [id number]          PATIENT TYPE:  INP   LOCATION:  2508                         FACILITY:  [facility]   PHYSICIAN:  Nicholas R. Juanda Chance, MD, FACCDATE OF BIRTH:  [birth year]   DATE OF PROCEDURE:  07/22/2008  DATE OF DISCHARGE:                            CARDIAC CATHETERIZATION   CLINICAL HISTORY:  [name] is 65 years old.  In 2007, he had inferior  wall infarction treated with a bare-metal stent to the mid-right  coronary by Dr. Samule Ohm.  He did well after that and was recently  admitted to [facility] with chest pain and positive enzymes  consistent with non-ST-elevation myocardial infarction.  He does have a  history of ongoing tobacco use and hyperlipidemia.   PROCEDURE IN DETAIL:  The procedure was performed via the right femoral  artery using the arterial sheath and a 5-French preformed coronary  catheters.  A front wall arterial puncture was performed and Omnipaque  contrast was used.  After complication of the diagnostic study, we made  decision to proceed with intervention on the right coronary artery.  There was an in-stent restenosis which was focal in the mid-right  coronary and there was a lesion in the distal right coronary as well.  We chose an AL-1 6-French guiding catheter with side holes because the  vessel was quite tortuous and we needed extra support.  The patient was  given bivalirudin bolus infusion and was given an additional 300 mg  Plavix load and then had been given 4 chewable aspirin.  We passed a  Prowater wire down the vessel across the lesion without difficulty.  We  then went in with a 4.0 x 10 mm cutting balloon and performed 4  inflations up to 10 atmospheres for 30 seconds.  We then further dilated  with a 4.5 x 50 mm Quantum Maverick performing one inflation up to 16  atmospheres for 30 seconds.   We then decided that the distal lesion was tight enough to treat.   This  was in the distal right coronary artery and extended into the posterior  descending branch.  We predilated the lesion with a 2.5 x 12 mm apex  balloon performing two inflations up to 10 atmospheres for 30 seconds.  We then deployed a 3.0 x 18 mm Xience stent crossing the posterolateral  branch and deploying this with one inflation of 15 atmospheres for 30  seconds.  We postdilated with a 3.5 x 15 mm Elgin Voyager performing two  inflations up to 14 atmospheres for 30 seconds.  Final diagnoses were  then performed with a guiding catheter.  The patient tolerated the  procedure well and left the laboratory in satisfactory condition.   RESULTS:  Aortic pressure was 115/54 with mean of 85.  Left index  pressure was 115/10.   Left main:  There was no left main coronary artery since the circumflex  arose anomalously from the right coronary artery.   Left anterior descending artery:  The left anterior descending artery  gave rise to a large diagonal  branch, septal perforator, and 2 small  diagonal branches.  There was 40% narrowing in the proximal LAD after  the diagonal branch and there was 50% narrowing in the very proximal and  ostium of the first diagonal branch.  There was moderate calcification  and no irregularities.   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  This was a small vessel that was diffusely  diseased with 70-80% stenoses in its proximal to midportion.  It filled  2 small marginal branches.  This vessel was also significantly diseased  on the prior study.   Right coronary artery:  The right coronary artery was a moderately large  vessel that gave rise to a posterior descending branch and a large  posterolateral branch.  There were multiple right angle turns in the  proximal vessel and there was a long stent in the midvessel which  traversed to a right angle bend.  There was a 90% focal stenosis just  before the second  bend and the midvessel.  There was also 70-80%  narrowing in the distal right coronary artery which extended just to the  posterior descending branch.  There was 60% narrowing in the  posterolateral branch.   Left ventriculogram:  The left ventriculogram performed in the RAO  projection showed hypokinesis of the inferobasal segment.  The overall  wall motion was good with an estimated ejection fraction of 60%.   Following cutting balloon angioplasty, the lesion within the stent in  the mid-right coronary stenosis improved from 90% to 10%.   Following stenting of the lesion, the distal right coronary stenosis  improved from 80% to 0%..   CONCLUSION:  1. 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  2. Successful percutaneous coronary intervention of the in-stent      restenotic lesion in the mid-right coronary using a cutting balloon      angioplasty with improvement in central narrowing from 90% to 10%.  3. Successful percutaneous coronary intervention of the lesion in the      distal right coronary using a Xience drug-eluting stent with      improvement in central narrowing from 80% to 0%.   DISPOSITION:  The patient returned to the [facility] room for further  observation.  Recommend Plavix for at least a year.      Nicholas Elvera Lennox Juanda Chance, MD, [facility]  Electronically Signed     BRB/MEDQ  D:  07/22/2008  T:  07/23/2008  Job:  [id number]   cc:   Angus G. Renard Matter, MD  Gerrit Friends. Dietrich Pates, MD, [facility]

## 2010-10-27 NOTE — Group Therapy Note (Signed)
[name], [name]                 ACCOUNT NO.:  [id number]   MEDICAL RECORD NO.:  [id number]          PATIENT TYPE:  INP   LOCATION:  A225                          FACILITY:  [facility]   PHYSICIAN:  Angus G. Renard Matter, MD   DATE OF BIRTH:  03-05-1946   DATE OF PROCEDURE:  DATE OF DISCHARGE:                                 PROGRESS NOTE   This patient has known coronary artery disease with previous MI.  Was  admitted with chest pain.  He was seen in consultation by Dr. Jens Som,  who felt this pain was mostly atypical and suggested continuation of the  cardiac enzymes.  Had suggested stress Myoview today.  The patient's  status has remained stable.   OBJECTIVE:  VITAL SIGNS:  Blood pressure 103/63, respirations 18, pulse  56, temperature 97.1.  His cardiac markers remain relatively normal.  HEART:  Regular rhythm.  LUNGS:  Clear to P&A.  ABDOMEN:  No palpable organs or masses.   ASSESSMENT:  The patient does have known coronary artery disease with  previous myocardial infarction.  He is readmitted on this occasion with  chest pain.   PLAN:  To proceed with cardiac stress testing today.      Angus G. Renard Matter, MD  Electronically Signed     AGM/MEDQ  D:  01/19/2007  T:  01/19/2007  Job:  [id number]

## 2010-10-27 NOTE — Consult Note (Signed)
NAMESAMAD, [name]                 ACCOUNT NO.:  [id number]   MEDICAL RECORD NO.:  [id number]          PATIENT TYPE:  INP   LOCATION:  A225                          FACILITY:  [facility]   PHYSICIAN:  Madolyn Frieze. Jens Som, MD, FACCDATE OF BIRTH:  11/12/1945   DATE OF CONSULTATION:  01/18/2007  DATE OF DISCHARGE:                                 CONSULTATION   REFERRING PHYSICIAN:  Dr. Ishmael Holter. McInnis.   REASON FOR CONSULTATION:  Mr. Makin is a 65 year old gentleman with a  past medical history of coronary artery disease and hyperlipidemia, who  we are asked to evaluate for chest pain.  His cardiac history dates back  to May 31, 2006, when he presented with acute inferior myocardial  infarction to [facility].  He underwent stent placement to a  totally occluded right coronary artery.  His ejection fraction at that  time was 60% with posterobasal and mid inferior akinesis.  There was a  50% diagonal as well and a 50% LAD.  The circumflex arose aberrantly  from the proximal right coronary artery.  It was a small vessel and  there were serial 50% and 80% lesions in the proximal vessel.  It was a  small vessel.  There was a 60% stenosis in the RCA distal to the stent  placement site.  Since that time, he has done well.  He does not have  dyspnea on exertion, orthopnea, PND, pedal edema, palpitations,  presyncope, syncope or exertional chest pain.  His saw Dr. Renard Matter  yesterday due to some sinus difficulties.  He described the pain in his  left chest area; he finds it difficult to describe.  The pain increases  with placing his left upper extremity above his head and improves with  lowering his arm.  The pain is not pleuritic.  It is not exertional.  It  lasts for several minutes and resolves spontaneously, but it has  returned.  There is mild nausea, but there is no shortness of breath or  diaphoresis.  It is unlike his previous infarct pain.  Because of his  symptoms, he was  admitted and we were asked to further evaluate.   MEDICATIONS AT PRESENT:  1. Aspirin 325 mg p.o. daily.  2. Lipitor 80 mg p.o. daily.  3. Carvedilol 6.25 mg p.o. b.i.d.  4. Plavix 75 mg p.o. daily.  5. Afrin nasal spray.  6. Guaifenesin 600 mg p.o. b.i.d.   ALLERGIES:  He has no known drug allergies.   SOCIAL HISTORY:  He does smoke and he occasionally consumes alcohol.  His family history is positive for coronary artery disease.   PAST MEDICAL HISTORY:  Significant for hyperlipidemia, but there is no  diabetes mellitus or hypertension by his report.  He has a history of  coronary disease as described in the HPI.  He also has a prior history  of gout.  He has had no previous surgeries.   REVIEW OF SYSTEMS:  He denies any headaches or fevers, or chills.  He  has had a cough that has been productive.  There is  no hemoptysis.  There is no dysphagia, odynophagia, melena or hematochezia.  There is no  dysuria or hematuria.  There is no rash or seizure activity.  There is  no orthopnea, PND or pedal edema.  There is no claudication, but there  can be arthralgias.  The remaining systems are negative.   PHYSICAL EXAM:  VITAL SIGNS:  Exam today shows a blood pressure of  124/74 and his pulse is 48.  He is afebrile.  GENERAL:  He is well-developed and well-nourished, in no acute distress.  His skin is warm and dry.  He does not appear to be depressed and there  is no peripheral clubbing.  BACK:  Normal.  HEENT:  Normal with normal eyelids.  NECK:  Supple with a normal upstroke bilaterally and there are no bruits  noted.  There is no jugular venous distension and no thyromegaly is  noted.  CHEST:  Clear to auscultation with normal expansion.  CARDIOVASCULAR:  Exam reveals a regular rate and rhythm with normal S1  and S2.  There are no murmurs, rubs or gallops noted.  ABDOMEN:  Not tender or distended.  Positive bowel sounds.  No  hepatosplenomegaly and no masses appreciated.  There is  no abdominal  bruit.  He has 2+ femoral pulses bilaterally and no bruits.  EXTREMITIES:  Show no edema and I can palpate no cords.  His distal  pulses are diminished.  NEUROLOGIC:  Exam is grossly intact.   LABORATORY AND ACCESSORY CLINICAL DATA:  His electrocardiogram shows a  sinus rhythm at a rate of 59.  There is an RV conduction delay and a  prior inferior infarct is noted.   His initial enzymes are negative.  His hemoglobin is 14.8.  His  potassium is 4.1 with normal renal function.   DIAGNOSES:  1. Atypical chest pain -- the patient's symptoms are atypical and most      consistent with musculoskeletal pain.  However, he does have      residual coronary disease from his previous catheterization and a      Myoview was recommended at that time.  We will continue to cycle      enzymes.  If they are negative, then we will plan to risk-stratify      with a stress Myoview tomorrow morning.  He will continue on his      present cardiac medications including his aspirin, Plavix, statin      and beta blocker.  2. Hyperlipidemia -- he will continue on his statin.  3. History of gout -- per primary care.      Madolyn Frieze Jens Som, MD, [facility]  Electronically Signed     BSC/MEDQ  D:  01/18/2007  T:  01/18/2007  Job:  [id number]

## 2010-10-27 NOTE — H&P (Signed)
[name], [name] NO.:  [id number]   MEDICAL RECORD NO.:  [id number]          PATIENT TYPE:  INP   LOCATION:  2038                         FACILITY:  [facility]   PHYSICIAN:  Hillis Range, MD       DATE OF BIRTH:  Nov 20, 1945   DATE OF ADMISSION:  07/20/2008  DATE OF DISCHARGE:                              HISTORY & PHYSICAL   PRIMARY CARDIOLOGIST:  Gerrit Friends. Dietrich Pates, MD, [facility]   PRIMARY PHYSICIAN:  Angus G. Renard Matter, MD, [place]   REASON FOR ADMISSION:  Mr. Zapien is a 65 year old male, with history of  coronary artery disease and prior acute inferior myocardial infarction  in December 2007, who now presents from [facility] ED for further  evaluation of chest pain.   Mr. Duran has been followed by [place] in the past, by Dr. Dietrich Pates in  [facility] and was last seen shortly after he was treated for an  acute inferior myocardial infarction in December 2007.  He was found to  have 100% occlusion of the proximal right coronary artery, and underwent  successful bare-metal stenting, by Dr. Shawnie Pons, with no noted  complications.  Residual anatomy, however, was notable for 60% distal  RCA, 80% proximal CFX (small), and 50% ostial LAD stenosis.  Left  ventricular function was preserved.   Most recently, the patient was seen in consultation by our team at [facility] in August 2008, again for evaluation of chest pain.  In  this particular case, however, symptoms were felt to be atypical and he  was referred for an adenosine stress Myoview, which yielded evidence of  inferior infarct with minimal peri-infarct ischemia; EF 51%.   Mr. Primeau admits to incomplete compliance with his medications over  these past few years.  Although, he seems to suggest that he has been  taking his Plavix, it appears that he had not always taken this on a  daily basis.  More recently, he stopped taking this all together  approximately 1 month ago, after he completely  ran out.  He otherwise  reports continue compliance with his aspirin and Lipitor.   The patient presented to the emergency room at [facility] early this  morning with complaint of significant, less significant, left-sided  chest discomfort described as a heaviness.  He rated it a 7/10 and  informs me that it is somewhat reminiscent of his previous myocardial  infarction.  This occurred approximately 9 a.m. this morning, while he  was sitting, and he had not yet eaten.  He did take aspirin, but did not  take any nitroglycerin.  A family member brought him to the emergency  room where he presented with a blood pressure of 170/89 and pulse of 60.  He was treated with 4 baby aspirin, 75 mg of Plavix, nitro paste, and  Lovenox.   The patient is currently hemodynamically stable and painfree.  His  initial set of cardiac markers were normal and his electrocardiogram was  reportedly unremarkable.   ALLERGIES:  No known drug allergies.   HOME MEDICATIONS:  1. Aspirin  81 daily.  2. Lipitor 80 daily.   PAST MEDICAL HISTORY:  1. Multivessel CAD.  1A.  Status post inferior STEMI, December 2007, treated with bare-metal  stenting of 100% proximal RCA.  1B.  Residual 60% distal RCA; 80% proximal CFX (small); and, 50% ostial  LAD.  1C.  Normal LVEF.  1. Dyslipidemia.  2. Ongoing tobacco.  3. History of gout.   SURGICAL HISTORY:  None.   FAMILY HISTORY:  The patient resides in [place], alone.  He has 6  grown children.  He continues to work at [employer] here in [place].  He smokes approximately a pack a day, started at age 73.  He also drinks  approximately 2 beers a night.   FAMILY HISTORY:  Father deceased, fatal myocardial infarction at age 62.  Brother age 27, history of coronary artery disease.   REVIEW OF SYSTEMS:  Denies any recent development of exertional angina  pectoris with significant dyspnea.  Denies any symptoms suggestive of  active reflux disease.  Remaining  systems negative.   PHYSICAL EXAMINATION:  VITAL SIGNS:  Blood pressure currently 125/74;  pulse 52, regular; temperature afebrile; respirations 20; and sats 98%  on 2L.  Weight 69.3 kg on admission.  GENERAL:  A 65 year old male, sitting upright, in no distress.  HEENT:  Normocephalic and atraumatic.  PERRLA.  EOMI.  NECK:  Palpable bilateral femoral pulse without bruits; no JVD.  LUNGS:  Clear to auscultation in all fields.  HEART:  Regular rate and rhythm.  No significant murmurs.  No rubs or  gallops.  Abdomen:  Soft and nontender.  Intact bowel sounds.  No pulsatile mass  or bruits.  EXTREMITIES:  Palpable bilateral femoral pulses without bruits.  Intact  distal pulses without significant edema.  SKIN:  Warm and dry.\  MUSCULOSKELETAL:  Gross deformity.  NEUROLOGIC:  No focal deficit.    Admission chest x-ray, no acute disease.   LABORATORY DATA:  Sodium 139, potassium 4.2, BUN 11, creatinine 0.8, and  glucose 109.  WBC 5.3, hemoglobin 50.7, hematocrit 45 and platelets 154.  Cardiac enzymes (PO C):  MB 1.3, troponin I less than 0.05.   IMPRESSION:  1. Unstable angina pectoris.  2. Multivessel coronary artery disease.  2A.  Status post acute inferior ST-elevated myocardial infarction,  December 2007, treated with bare-metal stenting of 100% occluded,  proximal right coronary artery.  2B.  Residual noncritical coronary artery disease.  2C.  Normal left ventricular.  2D.  Low-risk adenosine Myoview; ejection fraction 51%, August 2008.  1. Noncompliance.  2. Longstanding tobacco.  3. Dyslipidemia.  4. Sinus bradycardia.   PLAN:  The patient presents with symptoms, which are worrisome for  unstable angina pectoris and, therefore, recommendation is to strongly  consider proceeding with cardiac catheterization on Monday.  A repeat  EKG here on admission is benign, with evidence of prior inferior MI, but  no acute changes.  The patient is currently hemodynamically stable  and  without chest.  However, he at this point somewhat reluctant to proceed  with a cardiac catheterization, and recommendation at this point is to  follow him closely, cycle cardiac markers to rule out myocardial  infarction, and continue medical therapy with aspirin, Plavix, nitro  paste, and Lovenox.  Beta-blocker will be deferred, given the noted  sinus bradycardia.   If serial cardiac markers are negative, or the patient has any recurrent  angina pectoris, then we strongly recommend that he reconsider and  proceed with cardiac catheterization on  Monday, as recommended.  A  repeat EKG has been ordered for the morning, and we will reassess his  lipid status at that time as well.      Gene Serpe, PA-C      Hillis Range, MD  Electronically Signed    GS/MEDQ  D:  07/20/2008  T:  07/21/2008  Job:  [id number]   cc:   Angus G. Renard Matter, MD

## 2010-10-30 NOTE — Op Note (Signed)
NAME:  [name], [name]                           ACCOUNT NO.:  [id number]   MEDICAL RECORD NO.:  [id number]                   PATIENT TYPE:  AMB   LOCATION:  DAY                                  FACILITY:  [facility]   PHYSICIAN:  Dennie Maizes, M.D.                DATE OF BIRTH:  10-Dec-1945   DATE OF PROCEDURE:  01/30/2003  DATE OF DISCHARGE:                                 OPERATIVE REPORT   PREOPERATIVE DIAGNOSES:  1. Recurrent balanitis.  2. 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   POSTOPERATIVE DIAGNOSES:  1. Recurrent balanitis.  2. 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   PROCEDURE:  1. Circumcision.  2. Excision of papilloma of left thigh and back, size 2.5 cm.   ANESTHESIA:  General.   SURGEON:  Dennie Maizes, M.D.   COMPLICATIONS:  None.   ESTIMATED BLOOD LOSS:  Minimal.   INDICATIONS FOR PROCEDURE:  A 65 year old male with history of recurrent  balanitis was taken to the OR today for circumcision under anesthesia.  He  also wanted two skin papilomas to be removed under the same anesthesia.   DESCRIPTION OF PROCEDURE:  The patient was placed in the left lateral  position.  The back was prepped and draped in sterile fashion.  Then, 1 cc  of 2% Xylocaine solution was infiltrated at the base of the papilloma in the  back.  The papilloma had a narrow pedicle.  The papilloma was excised, and  the base was fulgurated.  A dressing was applied.   General anesthesia was induced.  The patient was placed on the OR table in  the supine position.  The lower abdomen and genitalia as well as left thigh  were prepped and draped in a sterile fashion.  The foreskin was then clamped  at 6 and 12 o'clock positions with straight hemostats.  Dorsal and ventral  slits were made, and two lateral skin flaps were raised.  The redundant  foreskin was excised, and hemostasis was also obtained by cauterization.  The  edges of the foreskin were then approximated using 4-0  chromic.  A Vaseline  gauze dressing and Coban were applied to the penis.  The papilloma on the  left thigh was then excised, and the base was fulgurated.  A dressing was  applied.  The patient was transferred to the PACU in a satisfactory  condition.                                               Dennie Maizes, M.D.    SK/MEDQ  D:  01/30/2003  T:  01/30/2003  Job:  [id number]   cc:   Angus G. Renard Matter, M.D.  [address]  [place]  [place] [address]  Fax: [phone]

## 2010-10-30 NOTE — H&P (Signed)
NAME:  [name], [name]                           ACCOUNT NO.:  [id number]   MEDICAL RECORD NO.:  [id number]                   PATIENT TYPE:  AMB   LOCATION:  DAY                                  FACILITY:  [facility]   PHYSICIAN:  Dennie Maizes, M.D.                DATE OF BIRTH:  1945-07-28   DATE OF ADMISSION:  DATE OF DISCHARGE:                                HISTORY & PHYSICAL   PREOPERATIVE HISTORY AND PHYSICAL:   CHIEF COMPLAINT:  Recurrent inflammation of the foreskin, skin lesions.   HISTORY OF PRESENT ILLNESS:  This 65 year old male was referred to me by Dr.  Renard Matter.  He complains of having recurrent inflammation of the foreskin and  he has noticed a white discharge from the prepuce for several years.  He  wants to be circumcised.  He also has noticed skin tags over the left thigh  as well as the bags.  He wants the lesions to be removed under the same  anesthesia.   The patient did not have any voiding difficulty.  He has urinary frequency  x5-6 and nocturia x2.  There is no history of dysuria, hematuria, or history  of urinary tract infections.  There is no past history of urolithiasis.   PAST MEDICAL HISTORY:  History of hypercholesterolemia, gout.   MEDICATIONS:  1. __________ 10 mg p.o. daily.  2. Niaspan.  3. Motrin.   ALLERGIES:  None.   OPERATIONS:  None.   FAMILY HISTORY:  Positive for coronary artery disease and myocardial  infarction.   PHYSICAL EXAMINATION:  HEENT:  Normal.  NECK:  No masses.  LUNGS:  Clear to auscultation.  HEART:  Regular rate and rhythm no murmurs.  ABDOMEN:  Soft.  No palpable flank mass.  No CVA tenderness.  Bladder not  palpable.  GENITALIA: Penis--there is inflammation and scarring of the foreskin  suggestive of recurrent balanitis.  Testes are normal.  RECTAL:  A 30-gram size benign prostate.  SKIN: The patient has 2 skin tags over the left thigh measuring 1.5 cm in  size; and over the back measuring 1 cm in size.   IMPRESSION:  1. Recurrent balanitis.  2. Skin lesions of left thigh and back.   PLAN:  1. I have discussed with the patient regarding the management regarding the     management and options.  He is scheduled to undergo circumcision.  2. Removal of the skin lesions under anesthesia in day hospital.   I have discussed with him regarding the diagnosis, operative details,  alternative treatments, outcome, possible risks and complications and he has  agreed for the procedure to be done.  Dennie Maizes, M.D.    SK/MEDQ  D:  01/29/2003  T:  01/29/2003  Job:  [id number]   cc:   Angus G. Renard Matter, M.D.  [address]  [place]  [place] [address]  Fax: [phone]

## 2010-10-30 NOTE — Letter (Signed)
June 16, 2006    Angus G. Renard Matter, MD  [phone] [address]  [place], [place] [address]   RE:  HILDA, WEXLER  MRN:  [id number]  /  DOB:  July 26, 1945   Dr. Thalia Party,   Mr. Luckman returns to the office following a recent inferior myocardial  infarction treated with urgent placement of a bare metal stent in the  right coronary artery at [facility], approximately 2 weeks ago. He has  done very well since discharge with no chest discomfort, nor dyspnea.  His cardiac catheterization site has been benign. He has been tapering  cigarette smoking with the assistance of Chantix 1 mg q.d. His other  medications include; atorvastatin 80 mg q.d., aspirin 325 mg q.d.,  clopidogrel 75 mg q.d., carvedilol 6.25 mg b.i.d.   On exam, pleasant trim gentleman in no acute distress. The weight is  156, blood pressure 130/75, heart rate 62 and regular, respirations 16.  NECK: No jugular venous distension, normal carotid upstrokes without  bruits.  LUNGS: Clear.  CARDIAC: Normal first and second heart sounds, fourth heart sound  present.  ABDOMEN: Soft and nontender, aortic pulsation barely palpable, no  organomegaly, no masses.  EXTREMITIES: Mild residual bruising above the catheterization site,  which is benign. Normal right femoral artery pulse without bruits.   EKG: Normal sinus rhythm, borderline left arterial abnormality,  incomplete right bundle branch block, left axis deviation, inferior  myocardial infarction of indeterminate age.   IMPRESSION:  Mr. Palka is doing generally well. Metoprolol is more  thoroughly studied in post myocardial infarction than carvedilol. The  later will be discontinued and the former started at a  dose of 25 mg b.i.d. The patient will continue Chantix and is encouraged  to completely refrain from cigarette smoking. Alcohol consumption is  approximately 2 ounces a day, which is acceptable. He will return to  work next week. A lipid profile and chemistry profile  are pending. I  will reassess this nice gentleman in 1 month.    Sincerely,      Gerrit Friends. Dietrich Pates, MD, [facility]  Electronically Signed    RMR/MedQ  DD: 06/16/2006  DT: 06/16/2006  Job #: [id number]

## 2010-10-30 NOTE — Discharge Summary (Signed)
[name], [name]                 ACCOUNT NO.:  [id number]   MEDICAL RECORD NO.:  [id number]          PATIENT TYPE:  INP   LOCATION:  6531                         FACILITY:  [facility]   PHYSICIAN:  Nicolasa Ducking, ANP DATE OF BIRTH:  [birth year]   DATE OF ADMISSION:  05/31/2006  DATE OF DISCHARGE:  06/02/2006                               DISCHARGE SUMMARY   PRIMARY CARE PHYSICIAN:  Angus G. Renard Matter, M.D.   PRIMARY CARDIOLOGIST:  Gerrit Friends. Dietrich Pates, MD, [facility]   PRINCIPAL DIAGNOSIS:  Acute inferior ST elevation myocardial infarction.   SECONDARY DIAGNOSES:  1. Coronary artery disease.  2. Hyperlipidemia.  3. Ongoing tobacco abuse.  4. Impaired glucose tolerance.   ALLERGIES:  NO KNOWN DRUG ALLERGIES.   PROCEDURE:  Left heart cardiac catheterization with PCI and stenting of  the right coronary artery with placement of a 4.5 x 38-mm Multilink  Ultrasound bare metal stent.   HISTORY OF PRESENT ILLNESS:  A 65 year old male with no prior history of  coronary artery disease who was in his usual state of health until  approximately 8 o'clock a.m. on May 31, 2006 when he developed  substernal chest discomfort prompting him to present to [facility] where ECG revealed inferior ST-segment elevation.  He was  treated sublingual nitroglycerin, heparin and metoprolol and transferred  to [facility] for further evaluation.   HOSPITAL COURSE:  The patient was taken urgently to the cardiac  catheterization laboratory where he underwent left heart cardiac  catheterization revealing a total occlusion of the proximal right  coronary artery with nonobstructive disease in the left coronary tree.  He then underwent successful PCI and stenting of the proximal right  coronary artery with replacement of a 4.5 x 38-mm Multilink Ultram bare  metal stent.  He tolerated this procedure well and eventually peaked his  CK at 2334, MB at 219.5 and troponin I at 49.21.  He was monitored in  the  coronary intensive care unit and was counseled on the importance of  smoking cessation and initiated on Chantix therapy, as well.  He was  otherwise initiated on aspirin, beta blocker, Plavix and statin therapy,  which, up to this point, he has tolerated.  He was transferred to step-  down on June 01, 2006 and has been ambulating both with cardiac  rehabilitation and on his own without recurrent discomfort or  limitations.  We have noted that his fasting sugars have been slightly  elevated at 108 and 111 over the past 2 days, and his hemoglobin A1c is  normal at 5.6.  He will be discharged home today in satisfactory  condition.   DISCHARGE LABORATORIES:  Hemoglobin 15.1, hematocrit 43.2, WBC 9.1,  platelets 199.  Sodium 137, potassium 4.0, chloride 101, CO2 of 27, BUN  10, creatinine 0.7, glucose 111.  Total bilirubin 0.5, alkaline  phosphatase 44, AST 27, ALT 31, total protein 7.0, albumin 4.0, calcium  9.1, hemoglobin A1c of 5.6.  CK 814, MB 44.9, troponin I 32.31.  Total  cholesterol 217, triglycerides 454, HDL 37, LDL was unable to be  calculated  secondary to hypertriglyceridemia.   DISPOSITION:  The patient is being discharged home today in good  condition.   FOLLOW-UP PLAN AND APPOINTMENTS:  1. He has a follow-up appointment with Dr. Ponce de Leon [name] in the      [facility] on June 16, 2006 at 3:15      p.m..  2. He is asked to follow up with his primary care physician, Dr.      Renard Matter, in 3-4 weeks.   DISCHARGE MEDICATIONS:  1. Aspirin 325 mg daily.  2. Plavix 75 mg daily.  3. Lipitor 80 mg q.h.s.  4. Coreg 6.75 mg b.i.d.  5. Nitroglycerin 0.4 mg sublingual p.r.n. chest pain.  6. Chantix as prescribed.   OUTSTANDING LABORATORY STUDIES:  None.   DURATION OF DISCHARGE ENCOUNTER:  35 minutes including physician time.      Nicolasa Ducking, ANP     CB/MEDQ  D:  06/02/2006  T:  06/02/2006  Job:  [id number]   cc:   Angus G. Renard Matter, MD

## 2010-10-30 NOTE — Cardiovascular Report (Signed)
NAMEOMIR, COOPRIDER                 ACCOUNT NO.:  [id number]   MEDICAL RECORD NO.:  [id number]          PATIENT TYPE:  OIB   LOCATION:  2901                         FACILITY:  [facility]   PHYSICIAN:  Salvadore Farber, MD  DATE OF BIRTH:  09/04/1945   DATE OF PROCEDURE:  05/31/2006  DATE OF DISCHARGE:                            CARDIAC CATHETERIZATION   PROCEDURE:  Left heart catheterization, left ventriculography, coronary  angiography, aspiration thrombectomy of the right coronary artery, bare  metal stent placed in the proximal right coronary artery, StarClose  closure of the right common femoral arteriotomy site.   INDICATIONS:  [name] is a 65 year old gentleman without prior  history of atherosclerotic coronary disease.  He developed abrupt onset  of substernal chest discomfort associated with some mild dyspnea at 8:30  this morning.  He presented to [facility] at 9:32.  Electrocardiogram demonstrated inferior ST elevations.  He was treated  with aspirin, heparin, and single bolus eptifibatide and transferred for  consideration of catheterization.  The patient arrived with ongoing 3/10  substernal chest discomfort and ST elevations inferiorly.  We decided to  proceed to emergent catheterization with an eye to percutaneous coronary  intervention.   PROCEDURAL TECHNIQUE:  Informed consent was obtained from the patient.  Under 1% lidocaine local anesthesia, a 6-French sheath was placed in the  right common femoral artery using the modified Seldinger technique.  Diagnostic angiography of the left system was performed using a JL-4  catheter.  A 6-French JR-4 guiding catheter was then advanced over the  wire and engaged in the ostium of the right coronary.  This demonstrated  occlusion of the proximal right coronary artery.  An additional bolus of  eptifibatide was administered, as was 600 mg of Plavix.  Additional  heparin was given throughout the case to maintain an ACT of  greater than  200 seconds.   A Prowater wire was advanced across the occlusion into the distal right  coronary artery without difficulty.  The vessel was opened using a 3.0 x  15 mm Maverick at 6 atmospheres for two inflations.  This established  TIMI III antegrade flow.  The lesion was then stented using a 4.5 x 38  mm ultra stent deployed at 14 atmospheres.  The stent was then  postdilated using a 4.5 x 20 mm Quantum balloon for three inflations at  18 atmospheres.  These inflations covered the entirety of the stent  segment.   Repeat angiography demonstrated no residual stenosis in the stented  segment.  No dissection and TIMI III flow distally.  However, there was  substantial residual thrombus at the bifurcation of the distal right  coronary artery.  I attempted to advance a Fetch aspiration thrombectomy  catheter, but was unable to pass it beyond the stented segment.  I,  therefore, used an over-the-wire balloon to exchange for a wiggle wire.  With this in place in the posterior left ventricular branch, I was able  to advance the Fetch catheter and perform aspiration thrombectomy.  Repeat angiography demonstrated no residual thrombus and approximately a  60% residual  stenosis at the ostium of the PDA and distal right coronary  artery.  There was TIMI III flow distally to this in both the PLV and  PDA.   Left heart catheterization and ventriculography were then performed  using a pigtail catheter.  Finally, the arteriotomy was sealed using a  StarClose device.  Complete hemostasis was obtained.  The patient was  transferred to the cardiac intensive care unit in stable condition,  having tolerated the procedure well.   COMPLICATIONS:  None.   FINDINGS:  1. LV:  139/12/27.  EF 60% with posterobasal and mid inferior akinesis      (as assessed after reperfusion).  2. No aortic stenosis or mitral regurgitation.  3. Left main:  There is no left main, as the circumflex is  aberrant.      The LAD gives rise to a very high diagonal.  This diagonal has a      50% ostial stenosis.  The LAD just after the origin of this      diagonal has an at least 50% ostial stenosis.  There are no      significant stenoses in the LAD beyond this.  It does gives rise to      moderate-sized diagonal branches.  4. Circumflex:  The circumflex arises aberrantly from the proximal      right coronary artery.  It is a small vessel.  There are serial 50      and 80% stenoses within the proximal circumflex.  The vessel was,      at most, 2 mm in diameter.  5. RCA:  Large, dominant vessel.  It was occluded proximally.  This      was stented to no residual.  There is a 60% stenosis in the distal      vessel extending into the proximal PDA.   IMPRESSION//RECOMMENDATIONS:  1. Myocardial infarction was due to occlusion of the proximal right      coronary artery which was stented to no residual.  2. Residual 60% stenosis in the distal right coronary artery.  3. Aberrant circumflex which is a small vessel and up to an 80%      stenosis proximally.  4. At least 50% stenosis of the ostium of the left anterior      descending.  5. Normal overall left ventricular systolic function with posterobasal      and mid inferior akinesis.   The patient will be admitted to the cardiac intensive care unit and  treated with aspirin, Plavix, beta blocker.  Plavix should be continued  for a minimum of 30 days and preferably for 9 months.  Will plan a  stress test at 6 weeks to assess the physiologic significance of the LAD  and PDA lesions.      Salvadore Farber, MD  Electronically Signed     WED/MEDQ  D:  05/31/2006  T:  06/01/2006  Job:  [id number]   cc:   Angus G. Renard Matter, MD

## 2010-10-30 NOTE — H&P (Signed)
[name], [name]                 ACCOUNT NO.:  [id number]   MEDICAL RECORD NO.:  [id number]          PATIENT TYPE:  OIB   LOCATION:  2807                         FACILITY:  [facility]   PHYSICIAN:  Salvadore Farber, MD  DATE OF BIRTH:  September 19, 1945   DATE OF ADMISSION:  05/31/2006  DATE OF DISCHARGE:                              HISTORY & PHYSICAL   PRIMARY CARE PHYSICIAN:  Dr. Renard Matter.   PRIMARY CARDIOLOGIST:  Primary cardiologist is new and is Dr. Samule Ohm.   CHIEF COMPLAINTS:  Chest pain.   HISTORY OF PRESENT ILLNESS:  [name] is a 65 year old male with no  previous history of coronary artery disease.  He was in his usual state  of health upon waking up at approximately 8 o'clock this morning.  He  had onset of substernal chest pain at approximately 8:30 a.m..  When his  symptoms did not resolve, he went to the [facility] emergency room at  approximately 9:30.  He was evaluated there by the physician who noted  his EKG with ST elevation in the inferior leads.  He was treated there  with metoprolol, heparin, aspirin 81 mg x4, sublingual nitroglycerin.  And transferred urgently to [facility] for further evaluation  and treatment.   [name] currently describes his pain as a 1 or 2/10.  He had one  previous episode 2 days ago that started in the evening and resolved  spontaneously.  He states he does not take anything for it and  eventually does golf.  He was pain free when he woke up yesterday  morning and did not have any other symptoms until today.  His pain is  associated with slight shortness of breath.  He denies diaphoresis,  nausea or vomiting.   PAST MEDICAL HISTORY:  1. Hypercholesterolemia.  2. Family history of coronary artery disease.  3. Ongoing tobacco use.   SURGICAL HISTORY:  He is status post circumcision .   ALLERGIES:  No known drug allergies.   CURRENT MEDICATIONS:  1. Lipitor unclear dose daily.  2. P.r.n. Tylenol.   SOCIAL HISTORY:  He  lives in [place].  His son and his son's wife  live with him.  He works at [employer].  He has a greater than 50 pack-  year history of tobacco use.  He denies drug abuse.  He states he has 1-  2 beers a couple of times a week.   FAMILY HISTORY:  His mother died in her 53s with no history of coronary  artery disease, and his father died at age 28 of an MI.  He has no  siblings with coronary artery disease.   REVIEW OF SYSTEMS:  He has occasional urinary frequency.  He has  occasional arthralgias.  He denies hematemesis, hemoptysis or melena.  He has occasional cough but denies wheezing.  He denies gastroesophageal  reflux disease symptoms.  Review of systems is otherwise negative.   PHYSICAL EXAMINATION:  GENERAL:  This is a well-developed, well-  nourished white male in no acute distress.  HEENT:  His head is normocephalic and  atraumatic with extraocular  movements intact.  Sclera clear.  Nares without discharge.  NECK:  There is no JVD and no thyromegaly is noted.  CV:  Heart is regular in rate and rhythm.  LUNGS:  Essentially clear to auscultation bilaterally.  SKIN:  No rashes or lesions are noted.  ABDOMEN:  Soft and nontender with active bowel sounds.  EXTREMITIES:  There is no cyanosis or edema noted.  Distal pulses are  intact.  No femoral bruits are appreciated.  MUSCULOSKELETAL:  There is no joint deformity or effusions.  NEUROLOGICAL:  He is alert and oriented with cranial nerves II-XII  grossly intact.   STUDIES:  Chest x-ray performed at [facility] showed atelectasis or  infiltrate at the left lung base.   ECG shows inferior ST elevation and is sinus rhythm.   LABORATORY VALUES:  Hemoglobin 16.1, hematocrit 46.9, WBCs 9.1,  platelets 248.  Sodium 138, potassium 4.1, chloride 102, glucose 157,  BUN 8, creatinine 0.8.  Other C-met values within normal limits and  initial point of care markers are negative.  INR 0.9, PTT 26.   IMPRESSION:  1. Acute inferior MI.   [name] is currently in the cath lab with      further evaluation and treatment depending on the results of      catheterization.  2. Hyperlipidemia.  His Lipitor will be increased to 80 mg a day and      fasting lipid profile will be checked in a.m..  3. Hyperglycemia.  Check an A1c.  4. Ongoing tobacco use.  Cessation is advised and a consult will be      ordered.      Nicholas Demark, PA-C      Salvadore Farber, MD  Electronically Signed    RB/MEDQ  D:  05/31/2006  T:  05/31/2006  Job:  [id number]

## 2011-02-01 ENCOUNTER — Other Ambulatory Visit: Payer: Self-pay | Admitting: Cardiology

## 2011-03-29 LAB — DIFFERENTIAL
Basophils Relative: 0
Eosinophils Absolute: 0.3
Lymphs Abs: 1.4
Neutro Abs: 4.8
Neutrophils Relative %: 71

## 2011-03-29 LAB — CARDIAC PANEL(CRET KIN+CKTOT+MB+TROPI)
CK, MB: 2.8
Relative Index: INVALID
Troponin I: 0.02
Troponin I: 0.03

## 2011-03-29 LAB — COMPREHENSIVE METABOLIC PANEL
ALT: 30
BUN: 11
CO2: 29
Calcium: 9.4
GFR calc non Af Amer: >60
Glucose, Bld: 127 — ABNORMAL HIGH
Sodium: 141

## 2011-03-29 LAB — CBC
HCT: 43.1
Hemoglobin: 14.8
MCHC: 34.4
MCV: 89.7
RBC: 4.81

## 2011-09-28 ENCOUNTER — Ambulatory Visit (INDEPENDENT_AMBULATORY_CARE_PROVIDER_SITE_OTHER): Payer: BC Managed Care – PPO | Admitting: Cardiology

## 2011-09-28 ENCOUNTER — Encounter: Payer: Self-pay | Admitting: Cardiology

## 2011-09-28 VITALS — BP 132/78 | HR 89 | Ht 68.0 in | Wt 157.0 lb

## 2011-09-28 DIAGNOSIS — E785 Hyperlipidemia, unspecified: Secondary | ICD-10-CM

## 2011-09-28 DIAGNOSIS — F17200 Nicotine dependence, unspecified, uncomplicated: Secondary | ICD-10-CM

## 2011-09-28 DIAGNOSIS — E782 Mixed hyperlipidemia: Secondary | ICD-10-CM

## 2011-09-28 DIAGNOSIS — I10 Essential (primary) hypertension: Secondary | ICD-10-CM | POA: Insufficient documentation

## 2011-09-28 DIAGNOSIS — I2510 Atherosclerotic heart disease of native coronary artery without angina pectoris: Secondary | ICD-10-CM

## 2011-09-28 DIAGNOSIS — Z720 Tobacco use: Secondary | ICD-10-CM | POA: Insufficient documentation

## 2011-09-28 DIAGNOSIS — M109 Gout, unspecified: Secondary | ICD-10-CM

## 2011-09-28 NOTE — Progress Notes (Signed)
Patient ID: [name], male   DOB: Jul 15, 1945, 66 y.o.   MRN: [id number]  HPI: Scheduled return visit for this very nice gentleman with coronary artery disease and multiple cardiovascular risk factors.  Since his last visit, he has done quite well.  He reports no significant respiratory illnesses and no history of hospitalization for pneumonia or bronchitis.  He continues to smoke cigarettes, but has decreased consumption to less than one half pack per day.  He experiences no chest discomfort or dyspnea, but lifestyle is relatively sedentary during the winter.  He continues to work full-time for [employer] in a job that is not physically demanding.  Prior to Admission medications   Medication Sig Start Date End Date Taking? Authorizing Provider  aspirin 81 MG tablet Take 81 mg by mouth daily.     Yes Historical Provider, MD  LIPITOR 80 MG tablet TAKE (1) TABLET BY MOUTH AT BEDTIME. 02/01/11  Yes Kathlen Brunswick, MD  PRINZIDE 20-12.5 MG per tablet TAKE ONE TABLET DAILY. 02/01/11  Yes Kathlen Brunswick, MD  sildenafil (VIAGRA) 50 MG tablet Take 1 tablet (50 mg total) by mouth daily as needed. 09/28/10  Yes Kathlen Brunswick, MD   No Known Allergies    Past medical history, social history, and family history reviewed and updated.  ROS: Denies orthopnea, PND, lightheadedness, palpitations or syncope.  No chronic cough or sputum production.  All other systems reviewed and are negative.  PHYSICAL EXAM: BP 132/78  Pulse 89  Ht 5\' 8"  (1.727 m)  Wt 71.215 kg (157 lb)  BMI 23.87 kg/m2  SpO2 99%  General-Well developed; no acute distress Body habitus-proportionate weight and height Neck-No JVD; no carotid bruits Lungs-clear lung fields; resonant to percussion Cardiovascular-normal PMI; normal S1 and S2; regular rate and rhythm; minimal systolic murmur Abdomen-normal bowel sounds; soft and non-tender without masses or organomegaly Musculoskeletal-No deformities, no cyanosis or  clubbing Neurologic-Normal cranial nerves; symmetric strength and tone Skin-Warm, no significant lesions Extremities-distal pulses intact; no edema  ASSESSMENT AND PLAN:   [name], MD 09/28/2011 3:52 PM

## 2011-09-28 NOTE — Assessment & Plan Note (Addendum)
The patient reports attempting to taper use of tobacco products.  He is currently consuming 1/2 pack per day, down from his previous usual consumption of one pack per day.  He is encouraged to quit entirely, but is not inclined to accept a formal attempt at this point.

## 2011-09-28 NOTE — Progress Notes (Signed)
Name: [name]    DOB: Jul 31, 1945  Age: 66 y.o.  MR#: [id number]       PCP:  Alice Reichert, MD, MD      Insurance: @PAYORNAME @   CC:    Chief Complaint  Patient presents with  . No complaints    1 year follow up / meds - bottles    VS BP 132/78  Pulse 89  Ht 5\' 8"  (1.727 m)  Wt 157 lb (71.215 kg)  BMI 23.87 kg/m2  SpO2 99%  Weights Current Weight  09/28/11 157 lb (71.215 kg)  09/28/10 148 lb (67.132 kg)  10/21/09 154 lb (69.854 kg)    Blood Pressure  BP Readings from Last 3 Encounters:  09/28/11 132/78  09/28/10 108/65  10/21/09 150/81     Admit date:  (Not on file) Last encounter with RMR:  Visit date not found   Allergy No Known Allergies  Current Outpatient Prescriptions  Medication Sig Dispense Refill  . aspirin 81 MG tablet Take 81 mg by mouth daily.        . clopidogrel (PLAVIX) 75 MG tablet Take 75 mg by mouth daily.        Marland Kitchen LIPITOR 80 MG tablet TAKE (1) TABLET BY MOUTH AT BEDTIME.  90 each  4  . PRINZIDE 20-12.5 MG per tablet TAKE ONE TABLET DAILY.  90 each  4  . sildenafil (VIAGRA) 50 MG tablet Take 1 tablet (50 mg total) by mouth daily as needed.  5 tablet  3    Discontinued Meds:    Medications Discontinued During This Encounter  Medication Reason  . varenicline (CHANTIX CONTINUING MONTH PAK) 1 MG tablet Completed Course  . varenicline (CHANTIX STARTING MONTH PAK) 0.5 MG X 11 & 1 MG X 42 tablet Completed Course  . Naproxen Sodium (ALEVE) 220 MG CAPS Patient has not taken in last 30 days    Patient Active Problem List  Diagnoses  . HYPERLIPIDEMIA  . Gout  . TOBACCO ABUSE  . ATHEROSCLEROTIC CARDIOVASCULAR DISEASE  . Erectile dysfunction    LABS No visits with results within 3 Month(s) from this visit. Latest known visit with results is:  Orders Only on 10/19/2010  Component Date Value  . Cholesterol 10/19/2010 142   . Triglycerides 10/19/2010 78   . HDL 10/19/2010 45   . Total CHOL/HDL Ratio 10/19/2010 3.2   . VLDL 10/19/2010  16   . LDL Cholesterol 10/19/2010 81      Results for this Opt Visit:     Results for orders placed in visit on 10/19/10  LIPID PANEL      Component Value Range   Cholesterol 142  0 - 200 (mg/dL)   Triglycerides 78  <119 (mg/dL)   HDL 45  >14 (mg/dL)   Total CHOL/HDL Ratio 3.2     VLDL 16  0 - 40 (mg/dL)   LDL Cholesterol 81  0 - 99 (mg/dL)    EKG No orders found for this or any previous visit.   Prior Assessment and Plan Problem List as of 09/28/2011          Cardiology Problems   HYPERLIPIDEMIA   Last Assessment & Plan Note   09/28/2010 Office Visit Signed 09/28/2010  3:46 PM by Kathlen Brunswick, MD    Lipid profile one year ago was good except for elevated triglycerides.  A repeat study will be obtained.    ATHEROSCLEROTIC CARDIOVASCULAR DISEASE   Last Assessment & Plan Note   09/28/2010  Office Visit Signed 09/28/2010  3:45 PM by Kathlen Brunswick, MD    No clinical evidence for regression of coronary disease.  Our focus will remain the optimal control of risk factors.  It has been close to 2 years since a drug-eluting stent was placed for restenosis in a BMS.  Since the original procedure was performed for acute MI, a case could be made for lifelong treatment with a P2Y12 receptor blocker; however, I would be inclined to discontinue clopidogrel at his next visit.      Other   Gout   TOBACCO ABUSE   Last Assessment & Plan Note   09/28/2010 Office Visit Signed 09/28/2010  3:47 PM by Kathlen Brunswick, MD    Patient was strongly encouraged to discontinue cigarette smoking.  Treatment with the Chantix will be initiated.    Erectile dysfunction   Last Assessment & Plan Note   09/28/2010 Office Visit Signed 09/28/2010  3:47 PM by Kathlen Brunswick, MD    Teaching was provided regarding the use of PDE inhibitors and a prescription for Viagra provided.        Imaging: No results found.   FRS Calculation: Score not calculated. Missing: Total Cholesterol

## 2011-09-28 NOTE — Assessment & Plan Note (Signed)
Control of hyperlipidemia has always been excellent with current therapy.  A repeat lipid profile will be obtained.

## 2011-09-28 NOTE — Assessment & Plan Note (Signed)
Blood pressure control is good with current medication, which will be continued.  A chemistry profile will be checked.

## 2011-09-28 NOTE — Patient Instructions (Signed)
Your physician recommends that you schedule a follow-up appointment in:  1 year  Your physician has recommended you make the following change in your medication:  1 - STOP Plavix  Your physician recommends that you return for lab work in: Within the week  STOP Smoking

## 2011-09-28 NOTE — Assessment & Plan Note (Signed)
Patient remains asymptomatic with respect to coronary disease.  Clopidogrel will be discontinued following more than 2 years of therapy.

## 2011-09-29 ENCOUNTER — Encounter: Payer: Self-pay | Admitting: *Deleted

## 2011-09-29 ENCOUNTER — Telehealth: Payer: Self-pay | Admitting: *Deleted

## 2011-09-29 NOTE — Telephone Encounter (Signed)
Unable to reach patient to advise him of additional orders received after he had left office.  Phone number is disconnected.  Will send a certified letter with instructions today.

## 2011-10-01 ENCOUNTER — Telehealth: Payer: Self-pay | Admitting: *Deleted

## 2011-10-01 NOTE — Telephone Encounter (Signed)
Received confirmation of receipt of Certified letter regarding new recommendations [phone]

## 2011-10-07 ENCOUNTER — Encounter: Payer: Self-pay | Admitting: *Deleted

## 2011-10-12 ENCOUNTER — Encounter: Payer: Self-pay | Admitting: *Deleted

## 2012-04-29 ENCOUNTER — Other Ambulatory Visit: Payer: Self-pay | Admitting: Cardiology

## 2012-06-05 ENCOUNTER — Other Ambulatory Visit: Payer: Self-pay | Admitting: Cardiology

## 2012-10-05 ENCOUNTER — Encounter: Payer: Self-pay | Admitting: Cardiology

## 2012-10-05 ENCOUNTER — Ambulatory Visit (INDEPENDENT_AMBULATORY_CARE_PROVIDER_SITE_OTHER): Payer: BC Managed Care – PPO | Admitting: Cardiology

## 2012-10-05 VITALS — BP 125/70 | HR 95 | Ht 68.0 in | Wt 158.0 lb

## 2012-10-05 DIAGNOSIS — I7090 Unspecified atherosclerosis: Secondary | ICD-10-CM

## 2012-10-05 DIAGNOSIS — F17200 Nicotine dependence, unspecified, uncomplicated: Secondary | ICD-10-CM

## 2012-10-05 DIAGNOSIS — N529 Male erectile dysfunction, unspecified: Secondary | ICD-10-CM

## 2012-10-05 DIAGNOSIS — E785 Hyperlipidemia, unspecified: Secondary | ICD-10-CM

## 2012-10-05 DIAGNOSIS — I10 Essential (primary) hypertension: Secondary | ICD-10-CM

## 2012-10-05 DIAGNOSIS — Z720 Tobacco use: Secondary | ICD-10-CM

## 2012-10-05 DIAGNOSIS — I2510 Atherosclerotic heart disease of native coronary artery without angina pectoris: Secondary | ICD-10-CM

## 2012-10-05 MED ORDER — SILDENAFIL CITRATE 50 MG PO TABS: 50.0000 mg | ORAL_TABLET | Freq: Every day | ORAL | Status: DC | PRN

## 2012-10-05 NOTE — Assessment & Plan Note (Signed)
Patient reports continued consumption of 0.5 pack per day. He is starting to generate some enthusiasm for quitting, but is not ready to do so yet.

## 2012-10-05 NOTE — Patient Instructions (Addendum)
Your physician wants you to follow-up in: 1 year. You will receive a reminder letter in the mail two months in advance. If you don't receive a letter, please call our office to schedule the follow-up appointment.  No changes have been made to your medications today.   Your physician recommends that you have lab work: cmp, fasting lipid profile.  We will call you with your results.

## 2012-10-05 NOTE — Assessment & Plan Note (Signed)
Most recent lipid profile from 2 years ago was excellent. Repeat testing will be performed.

## 2012-10-05 NOTE — Assessment & Plan Note (Signed)
Patient is doing extremely well, now 4 years from a second intervention in the right coronary artery. We'll continue to optimize treatment of cardiovascular risk factors.

## 2012-10-05 NOTE — Assessment & Plan Note (Signed)
Blood pressure control has been excellent in recent years. Current medications will be continued.

## 2012-10-05 NOTE — Progress Notes (Deleted)
Name: [name]    DOB: 08/26/1945  Age: 67 y.o.  MR#: [id number]       PCP:  Alice Reichert, MD      Insurance: Payor: BLUE CROSS BLUE SHIELD  Plan: BCBS [place] PPO  Product Type: *No Product type*    CC:   No chief complaint on file.   VS Filed Vitals:   10/05/12 1503  BP: 125/70  Pulse: 95  Height: 5\' 8"  (1.727 m)  Weight: 158 lb (71.668 kg)    Weights Current Weight  10/05/12 158 lb (71.668 kg)  09/28/11 157 lb (71.215 kg)  09/28/10 148 lb (67.132 kg)    Blood Pressure  BP Readings from Last 3 Encounters:  10/05/12 125/70  09/28/11 132/78  09/28/10 108/65     Admit date:  (Not on file) Last encounter with RMR:  06/05/2012   Allergy Review of patient's allergies indicates no known allergies.  Current Outpatient Prescriptions  Medication Sig Dispense Refill  . aspirin 81 MG tablet Take 81 mg by mouth daily.        Marland Kitchen LIPITOR 80 MG tablet TAKE (1) TABLET BY MOUTH AT BEDTIME.  30 tablet  11  . PRINZIDE 20-12.5 MG per tablet TAKE ONE TABLET DAILY.  90 tablet  3  . sildenafil (VIAGRA) 50 MG tablet Take 1 tablet (50 mg total) by mouth daily as needed.  5 tablet  3   No current facility-administered medications for this visit.    Discontinued Meds:   There are no discontinued medications.  Patient Active Problem List  Diagnosis  . Gout  . Erectile dysfunction  . Arteriosclerotic cardiovascular disease (ASCVD)  . Tobacco abuse  . Hyperlipidemia  . Hypertension    LABS    Component Value Date/Time   NA 137 10/21/2009 1925   NA 139 09/19/2009 1855   NA 134 09/04/2008   K 5.5* 10/21/2009 1925   K 4.4 09/19/2009 1855   K 3.5 09/04/2008   CL 102 10/21/2009 1925   CL 103 09/19/2009 1855   CL 101 09/04/2008   CO2 23 10/21/2009 1925   CO2 21 09/19/2009 1855   CO2 25 09/04/2008   GLUCOSE 94 10/21/2009 1925   GLUCOSE 86 09/19/2009 1855   GLUCOSE 145 09/04/2008   BUN 16 10/21/2009 1925   BUN 11 09/19/2009 1855   BUN 6 09/04/2008   CREATININE 0.93 10/21/2009 1925   CREATININE 0.83  09/19/2009 1855   CREATININE 0.65 09/04/2008   CREATININE 0.95 09/04/2008   CALCIUM 9.5 10/21/2009 1925   CALCIUM 8.9 09/19/2009 1855   CALCIUM 8.5 09/04/2008   GFRNONAA >60 07/23/2008 0655   GFRNONAA >60 07/20/2008 1100   GFRNONAA >60 01/17/2007 1910   GFRAA  Value: >60        The eGFR has been calculated using the MDRD equation. This calculation has not been validated in all clinical situations. eGFR's persistently <60 mL/min signify possible Chronic Kidney Disease. 07/23/2008 0655   GFRAA  Value: >60        The eGFR has been calculated using the MDRD equation. This calculation has not been validated in all clinical situations. eGFR's persistently <60 mL/min signify possible Chronic Kidney Disease. 07/20/2008 1100   GFRAA  Value: >60        The eGFR has been calculated using the MDRD equation. This calculation has not been validated in all clinical 01/17/2007 1910   CMP     Component Value Date/Time   NA 137 10/21/2009 1925  K 5.5* 10/21/2009 1925   CL 102 10/21/2009 1925   CO2 23 10/21/2009 1925   GLUCOSE 94 10/21/2009 1925   BUN 16 10/21/2009 1925   CREATININE 0.93 10/21/2009 1925   CALCIUM 9.5 10/21/2009 1925   PROT 6.6 09/19/2009 1855   ALBUMIN 4.3 09/19/2009 1855   AST 16 09/19/2009 1855   ALT 16 09/19/2009 1855   ALKPHOS 50 09/19/2009 1855   BILITOT 0.4 09/19/2009 1855   GFRNONAA >60 07/23/2008 0655   GFRAA  Value: >60        The eGFR has been calculated using the MDRD equation. This calculation has not been validated in all clinical situations. eGFR's persistently <60 mL/min signify possible Chronic Kidney Disease. 07/23/2008 0655       Component Value Date/Time   WBC 6.7 09/04/2008   WBC 5.9 07/22/2008 0420   WBC 6.1 07/21/2008 0340   HGB 14.4 09/04/2008   HGB 13.8 07/22/2008 0420   HGB 13.5 07/21/2008 0340   HCT 43 09/04/2008   HCT 40.3 07/22/2008 0420   HCT 38.7* 07/21/2008 0340   MCV 94.8 07/22/2008 0420   MCV 94.1 07/21/2008 0340   MCV 93.6 07/20/2008 1100    Lipid Panel     Component Value Date/Time   CHOL 142  10/19/2010 1125   TRIG 78 10/19/2010 1125   HDL 45 10/19/2010 1125   CHOLHDL 3.2 10/19/2010 1125   VLDL 16 10/19/2010 1125   LDLCALC 81 10/19/2010 1125    ABG No results found for this basename: phart, pco2, pco2art, po2, po2art, hco3, tco2, acidbasedef, o2sat     Lab Results  Component Value Date   TSH 0.653 ***Test methodology is 3rd generation TSH**** 07/20/2008   BNP (last 3 results) No results found for this basename: PROBNP,  in the last 8760 hours Cardiac Panel (last 3 results) No results found for this basename: CKTOTAL, CKMB, TROPONINI, RELINDX,  in the last 72 hours  Iron/TIBC/Ferritin No results found for this basename: iron, tibc, ferritin     EKG Orders placed in visit on 10/05/12  . EKG 12-LEAD     Prior Assessment and Plan Problem List as of 10/05/2012     ICD-9-CM   Gout   Erectile dysfunction   Last Assessment & Plan   09/28/2010 Office Visit Written 09/28/2010  3:47 PM by Kathlen Brunswick, MD     Teaching was provided regarding the use of PDE inhibitors and a prescription for Viagra provided.    Arteriosclerotic cardiovascular disease (ASCVD)   Last Assessment & Plan   09/28/2011 Office Visit Written 09/28/2011  3:55 PM by Kathlen Brunswick, MD     Patient remains asymptomatic with respect to coronary disease.  Clopidogrel will be discontinued following more than 2 years of therapy.    Tobacco abuse   Last Assessment & Plan   09/28/2011 Office Visit Edited 10/03/2011  5:06 PM by Kathlen Brunswick, MD     The patient reports attempting to taper use of tobacco products.  He is currently consuming 1/2 pack per day, down from his previous usual consumption of one pack per day.  He is encouraged to quit entirely, but is not inclined to accept a formal attempt at this point.    Hyperlipidemia   Last Assessment & Plan   09/28/2011 Office Visit Written 09/28/2011  3:55 PM by Kathlen Brunswick, MD     Control of hyperlipidemia has always been excellent with current therapy.  A  repeat lipid profile will  be obtained.    Hypertension   Last Assessment & Plan   09/28/2011 Office Visit Written 09/28/2011  3:56 PM by Kathlen Brunswick, MD     Blood pressure control is good with current medication, which will be continued.  A chemistry profile will be checked.        Imaging: No results found.

## 2012-10-05 NOTE — Progress Notes (Signed)
Patient ID: [name], male   DOB: 30-Aug-1945, 67 y.o.   MRN: [id number]  HPI: Schedule return visit for this very nice gentleman with coronary artery disease. His last clinical issue was in 2010 when he suffered restenosis of the right coronary artery treated with a cutting balloon to a previously placed stent and a new DES for distal disease. 2 years after that procedure, clopidogrel was discontinued. Over the past 12 months, has been asymptomatic and has experienced no new medical issues.  Current Outpatient Prescriptions  Medication Sig Dispense Refill  . aspirin 81 MG tablet Take 81 mg by mouth daily.        Marland Kitchen LIPITOR 80 MG tablet TAKE (1) TABLET BY MOUTH AT BEDTIME.  30 tablet  11  . PRINZIDE 20-12.5 MG per tablet TAKE ONE TABLET DAILY.  90 tablet  3  . sildenafil (VIAGRA) 50 MG tablet Take 1 tablet (50 mg total) by mouth daily as needed.  5 tablet  3   No current facility-administered medications for this visit.   No Known Allergies   Past medical history, social history, and family history reviewed and updated.  ROS: Denies dyspnea, chest discomfort, lightheadedness or syncope. He continues to be employed full time without difficulty. All other systems reviewed and are negative.  PHYSICAL EXAM: BP 125/70  Pulse 95  Ht 5\' 8"  (1.727 m)  Wt 71.668 kg (158 lb)  BMI 24.03 kg/m2;  Body mass index is 24.03 kg/(m^2). General-Well developed; no acute distress Body habitus-proportionate weight and height Neck-No JVD; no carotid bruits Lungs-clear lung fields; resonant to percussion Cardiovascular-normal PMI; normal S1 and S2; S4 present Abdomen-normal bowel sounds; soft and non-tender without masses or organomegaly Musculoskeletal-No deformities, no cyanosis or clubbing Neurologic-Normal cranial nerves; symmetric strength and tone Skin-Warm, no significant lesions Extremities-distal pulses intact; no edema  EKG: Normal sinus rhythm; right ventricular conduction delay; inferior  myocardial infarction, age undetermined. No previous tracing for comparison.  Mobile [name], MD 10/05/2012  3:29 PM  ASSESSMENT AND PLAN

## 2012-10-05 NOTE — Assessment & Plan Note (Signed)
Rx for Viagra provided.

## 2012-10-10 ENCOUNTER — Encounter: Payer: Self-pay | Admitting: *Deleted

## 2013-01-29 ENCOUNTER — Other Ambulatory Visit: Payer: Self-pay | Admitting: Cardiology

## 2013-09-22 ENCOUNTER — Other Ambulatory Visit: Payer: Self-pay | Admitting: Cardiology

## 2013-10-19 ENCOUNTER — Encounter: Payer: Self-pay | Admitting: Cardiology

## 2013-10-19 ENCOUNTER — Ambulatory Visit (INDEPENDENT_AMBULATORY_CARE_PROVIDER_SITE_OTHER): Payer: BC Managed Care – PPO | Admitting: Cardiology

## 2013-10-19 ENCOUNTER — Encounter (INDEPENDENT_AMBULATORY_CARE_PROVIDER_SITE_OTHER): Payer: Self-pay

## 2013-10-19 VITALS — BP 133/66 | HR 89 | Ht 69.0 in | Wt 156.0 lb

## 2013-10-19 DIAGNOSIS — I10 Essential (primary) hypertension: Secondary | ICD-10-CM

## 2013-10-19 DIAGNOSIS — I714 Abdominal aortic aneurysm, without rupture, unspecified: Secondary | ICD-10-CM

## 2013-10-19 MED ORDER — NICOTINE 7 MG/24HR TD PT24: MEDICATED_PATCH | TRANSDERMAL | Status: DC

## 2013-10-19 MED ORDER — NICOTINE 14 MG/24HR TD PT24: MEDICATED_PATCH | TRANSDERMAL | Status: DC

## 2013-10-19 NOTE — Progress Notes (Signed)
Clinical Summary Mr. Hyacinth MeekerMiller is a 68 y.o.male former patient of Dr Dietrich Patesothbart, this is our first visit together. He is seen for the following medical problems.  1. CAD - prior inferior MI in 2008, received BMS to RCA - NSTEMI 07/2008 due to RCA ISR, treated with cutting balloon and additional DES. LVEF 60% by LV gram at that time. - denies any chest pain. Denies any SOB or DOE - compliant with meds  2. HTN - does not check regularly - compliant with meds  3. Hyperlipidemia - no recent panel in our system - compliant with statin  4. Tobacco - 1/2 ppd - tried chantix before with limited benefit - interested in nicotine patch  Past Medical History  Diagnosis Date  . 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 Tobacco abuse     60 pack years continuing at one pack per day   . Gout     acute gouty arthropathy  . Hyperlipidemia   . Hypertension   . Erectile dysfunction      No Known Allergies   Current Outpatient Prescriptions  Medication Sig Dispense Refill  . aspirin 81 MG tablet Take 81 mg by mouth daily.        Marland Kitchen. atorvastatin (LIPITOR) 80 MG tablet TAKE ONE TABLET BY MOUTH ONCE DAILY AT BEDTIME.  30 tablet  6  . lisinopril-hydrochlorothiazide (PRINZIDE,ZESTORETIC) 20-12.5 MG per tablet TAKE ONE TABLET DAILY.  90 tablet  2  . sildenafil (VIAGRA) 50 MG tablet Take 1 tablet (50 mg total) by mouth daily as needed.  5 tablet  11   No current facility-administered medications for this visit.     Past Surgical History  Procedure Laterality Date  . Skin lesion excision    . Circumcision       No Known Allergies    No family history on file.   Social History Mr. Hyacinth MeekerMiller reports that he has been  smoking Cigarettes.  He has been smoking about 0.50 packs per day. He has never used smokeless tobacco. Mr. Hyacinth MeekerMiller reports that he does not drink alcohol.   Review of Systems CONSTITUTIONAL: No weight loss, fever, chills, weakness or fatigue.  HEENT: Eyes: No visual loss, blurred vision, double vision or yellow sclerae.No hearing loss, sneezing, congestion, runny nose or sore throat.  SKIN: No rash or itching.  CARDIOVASCULAR: per HPI RESPIRATORY: No shortness of breath, cough or sputum.  GASTROINTESTINAL: No anorexia, nausea, vomiting or diarrhea. No abdominal pain or blood.  GENITOURINARY: No burning on urination, no polyuria NEUROLOGICAL: No headache, dizziness, syncope, paralysis, ataxia, numbness or tingling in the extremities. No change in bowel or bladder control.  MUSCULOSKELETAL: No muscle, back pain, joint pain or stiffness.  LYMPHATICS: No enlarged nodes. No history of splenectomy.  PSYCHIATRIC: No history of depression or anxiety.  ENDOCRINOLOGIC: No reports of sweating, cold or heat intolerance. No polyuria or polydipsia.  Marland Kitchen.   Physical Examination p 89 bp 133/66 Wt 156 lbs BMI 23 Gen: resting comfortably, no acute distress HEENT: no scleral icterus, pupils equal round and reactive, no palptable cervical adenopathy,  CV: RRR, no m/r/g, no JVD, no carotid bruits Resp: Clear to auscultation bilaterally GI: abdomen is soft, non-tender, non-distended, normal bowel sounds, no hepatosplenomegaly  MSK: extremities are warm, no edema.  Skin: warm, no rash Neuro:  no focal deficits Psych: appropriate affect   Diagnostic Studies 07/2008 Cath RESULTS: Aortic pressure was 115/54 with mean of 85. Left index  pressure was 115/10.  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  Left anterior descending artery: The left anterior descending artery  gave rise to a large diagonal Italo Banton, septal perforator, and  2 small  diagonal branches. There was 40% narrowing in the proximal LAD after  the diagonal Mikkel Charrette and there was 50% narrowing in the very proximal and  ostium of the first diagonal Lanard Arguijo. There was moderate calcification  and no irregularities.  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 This was a small vessel that was diffusely  diseased with 70-80% stenoses in its proximal to midportion. It filled  2 small marginal branches. This vessel was also significantly diseased  on the prior study.  Right coronary artery: The right coronary artery was a moderately large  vessel that gave rise to a posterior descending Montague Corella and a large  posterolateral Kenzlei Runions. There were multiple right angle turns in the  proximal vessel and there was a long stent in the midvessel which  traversed to a right angle bend. There was a 90% focal stenosis just  before the second bend and the midvessel. There was also 70-80%  narrowing in the distal right coronary artery which extended just to the  posterior descending Sanaa Zilberman. There was 60% narrowing in the  posterolateral Hari Casaus.  Left ventriculogram: The left ventriculogram performed in the RAO  projection showed hypokinesis of the inferobasal segment. The overall  wall motion was good with an estimated ejection fraction of 60%.  Following cutting balloon angioplasty, the lesion within the stent in  the mid-right coronary stenosis improved from 90% to 10%.  Following stenting of the lesion, the distal right coronary stenosis  improved from 80% to 0%..  CONCLUSION:  1. 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  2. Successful percutaneous coronary intervention of the in-stent  restenotic lesion in the mid-right coronary using a cutting balloon  angioplasty with improvement in central narrowing from 90% to 10%.  3. Successful percutaneous coronary intervention of the lesion in the  distal right coronary using a Xience drug-eluting stent with  improvement in central narrowing from 80% to 0%.  [facility]: [facility]. Recommend Plavix for at least a year.     10/19/13 Clinic EKG NSR  Assessment and Plan   1. CAD - no current symptoms - continue risk factor modification and secondary prevention  2. HTN - at goal, continue current meds  3. Hyperlipidemia - repeat lipid panel  4. Tobacco - counseled on health benefits of cessation. Given Rx for nicotine patch.  5. AAA screen - male, 63>65 yo with smoking history - will order AAA US.      Antoine PocheJonathan F. Massimiliano Rohleder, M.D., F.A.C.C.

## 2013-10-19 NOTE — Patient Instructions (Signed)
Your physician wants you to follow-up in: 1 year You will receive a reminder letter in the mail two months in advance. If you don't receive a letter, please call our office to schedule the follow-up appointment.    Your physician has recommended you make the following change in your medication:    We will give you Nicoderm patches, please follow directions for use   We are going to schedule for ultrasound of your abdomen to look at your aorta. Please do not eat or drink 8 hours before test   Please get fasting blood work

## 2013-10-27 LAB — LIPID PANEL
CHOL/HDL RATIO: 3.1 ratio
Cholesterol: 144 mg/dL (ref 0–200)
HDL: 46 mg/dL (ref >39)
LDL CALC: 60 mg/dL (ref 0–99)
TRIGLYCERIDES: 191 mg/dL — AB (ref <150)
VLDL: 38 mg/dL (ref 0–40)

## 2013-10-27 LAB — COMPREHENSIVE METABOLIC PANEL
ALK PHOS: 40 U/L (ref 39–117)
ALT: 25 U/L (ref 0–53)
AST: 19 U/L (ref 0–37)
Albumin: 4.3 g/dL (ref 3.5–5.2)
BILIRUBIN TOTAL: 0.5 mg/dL (ref 0.2–1.2)
BUN: 19 mg/dL (ref 6–23)
CHLORIDE: 101 meq/L (ref 96–112)
CO2: 25 mEq/L (ref 19–32)
CREATININE: 0.95 mg/dL (ref 0.50–1.35)
Calcium: 9.4 mg/dL (ref 8.4–10.5)
Glucose, Bld: 96 mg/dL (ref 70–99)
Potassium: 5.5 mEq/L — ABNORMAL HIGH (ref 3.5–5.3)
Sodium: 134 mEq/L — ABNORMAL LOW (ref 135–145)
Total Protein: 7 g/dL (ref 6.0–8.3)

## 2013-10-27 LAB — CBC
HEMATOCRIT: 41.8 % (ref 39.0–52.0)
HEMOGLOBIN: 14.2 g/dL (ref 13.0–17.0)
MCH: 31.2 pg (ref 26.0–34.0)
MCHC: 34 g/dL (ref 30.0–36.0)
MCV: 91.9 fL (ref 78.0–100.0)
Platelets: 193 10*3/uL (ref 150–400)
RBC: 4.55 MIL/uL (ref 4.22–5.81)
RDW: 13.9 % (ref 11.5–15.5)
WBC: 6.8 10*3/uL (ref 4.0–10.5)

## 2013-10-27 LAB — TSH: TSH: 0.498 u[IU]/mL (ref 0.350–4.500)

## 2013-10-27 LAB — HEMOGLOBIN A1C
Hgb A1c MFr Bld: 5.8 % — ABNORMAL HIGH (ref <5.7)
Mean Plasma Glucose: 120 mg/dL — ABNORMAL HIGH (ref <117)

## 2013-10-31 ENCOUNTER — Other Ambulatory Visit: Payer: Self-pay

## 2013-10-31 ENCOUNTER — Telehealth: Payer: Self-pay

## 2013-10-31 DIAGNOSIS — I10 Essential (primary) hypertension: Secondary | ICD-10-CM

## 2013-10-31 MED ORDER — HYDROCHLOROTHIAZIDE 25 MG PO TABS: 25.0000 mg | ORAL_TABLET | Freq: Every day | ORAL | Status: DC

## 2013-10-31 MED ORDER — LISINOPRIL 10 MG PO TABS: 10.0000 mg | ORAL_TABLET | Freq: Every day | ORAL | Status: DC

## 2013-10-31 NOTE — Telephone Encounter (Signed)
I spoke with patient, reviewed labs and relayed new medication orders by Dr.Branch (Stop prinzide and start Lisinopril 10 mg daily and HCTZ 25 mg daily. He will have repeat BMET in 3 weeks   US to screen for AAA scheduled for 5/22 with registration at 7:45 am Nothing to eat or drink 6 hrs before

## 2013-11-02 ENCOUNTER — Ambulatory Visit (HOSPITAL_COMMUNITY)
Admission: RE | Admit: 2013-11-02 | Discharge: 2013-11-02 | Disposition: A | Discharge status: Home / Self Care | Payer: BC Managed Care – PPO | Source: Ambulatory Visit | Attending: Cardiology | Admitting: Cardiology

## 2013-11-02 DIAGNOSIS — I714 Abdominal aortic aneurysm, without rupture, unspecified: Secondary | ICD-10-CM

## 2013-11-02 DIAGNOSIS — I2510 Atherosclerotic heart disease of native coronary artery without angina pectoris: Secondary | ICD-10-CM | POA: Insufficient documentation

## 2013-11-02 DIAGNOSIS — Z87891 Personal history of nicotine dependence: Secondary | ICD-10-CM | POA: Insufficient documentation

## 2013-11-06 ENCOUNTER — Other Ambulatory Visit: Payer: Self-pay

## 2013-11-24 LAB — BASIC METABOLIC PANEL
BUN: 15 mg/dL (ref 6–23)
CALCIUM: 9.1 mg/dL (ref 8.4–10.5)
CO2: 25 meq/L (ref 19–32)
CREATININE: 0.96 mg/dL (ref 0.50–1.35)
Chloride: 101 mEq/L (ref 96–112)
Glucose, Bld: 94 mg/dL (ref 70–99)
Potassium: 5.4 mEq/L — ABNORMAL HIGH (ref 3.5–5.3)
SODIUM: 133 meq/L — AB (ref 135–145)

## 2014-03-28 ENCOUNTER — Telehealth: Payer: Self-pay | Admitting: *Deleted

## 2014-03-28 NOTE — Telephone Encounter (Signed)
Pt dropped off a paper from dr bowers for a release for treatment to have extract 4 teeth and upper partial put in. Sheet was given to Cathy/tmj

## 2014-03-28 NOTE — Telephone Encounter (Signed)
Form filled out by Dr.Koneswaran,given to Tanya at reception to scan to chart

## 2014-08-03 ENCOUNTER — Other Ambulatory Visit: Payer: Self-pay | Admitting: Cardiovascular Disease

## 2014-10-10 ENCOUNTER — Other Ambulatory Visit: Payer: Self-pay | Admitting: Cardiology

## 2014-10-10 ENCOUNTER — Other Ambulatory Visit: Payer: Self-pay | Admitting: Cardiovascular Disease

## 2014-11-02 ENCOUNTER — Other Ambulatory Visit: Payer: Self-pay | Admitting: Cardiology

## 2014-11-02 ENCOUNTER — Other Ambulatory Visit: Payer: Self-pay | Admitting: Cardiovascular Disease

## 2014-11-12 ENCOUNTER — Encounter: Payer: Self-pay | Admitting: Cardiology

## 2014-11-12 ENCOUNTER — Ambulatory Visit (INDEPENDENT_AMBULATORY_CARE_PROVIDER_SITE_OTHER): Payer: BLUE CROSS/BLUE SHIELD | Admitting: Cardiology

## 2014-11-12 VITALS — BP 104/60 | HR 90 | Ht 69.0 in | Wt 151.0 lb

## 2014-11-12 DIAGNOSIS — I2510 Atherosclerotic heart disease of native coronary artery without angina pectoris: Secondary | ICD-10-CM | POA: Diagnosis not present

## 2014-11-12 DIAGNOSIS — E785 Hyperlipidemia, unspecified: Secondary | ICD-10-CM

## 2014-11-12 DIAGNOSIS — Z131 Encounter for screening for diabetes mellitus: Secondary | ICD-10-CM | POA: Diagnosis not present

## 2014-11-12 DIAGNOSIS — I10 Essential (primary) hypertension: Secondary | ICD-10-CM | POA: Diagnosis not present

## 2014-11-12 NOTE — Progress Notes (Signed)
Clinical Summary [name] is a 69 y.o.male seen today for follow up of the following medical problems.   1. CAD - prior inferior MI in 2008, received BMS to RCA - NSTEMI 07/2008 due to RCA ISR, treated with cutting balloon and additional DES. LVEF 60% by LV gram at that time.  - denies any chest pain. Denies any SOB or DOE - compliant with meds  2. HTN - does not check regularly - compliant with meds  3. Hyperlipidemia - no recent panel in our system - compliant with statin  Past Medical History  Diagnosis Date  . 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 Tobacco abuse     60 pack years continuing at one pack per day   . Gout     acute gouty arthropathy  . Hyperlipidemia   . Hypertension   . Erectile dysfunction      No Known Allergies   Current Outpatient Prescriptions  Medication Sig Dispense Refill  . aspirin 81 MG tablet Take 81 mg by mouth daily.      Marland Kitchen atorvastatin (LIPITOR) 80 MG tablet TAKE ONE TABLET BY MOUTH ONCE DAILY AT BEDTIME. 30 tablet 11  . hydrochlorothiazide (HYDRODIURIL) 25 MG tablet TAKE ONE TABLET BY MOUTH ONCE DAILY. 90 tablet 3  . lisinopril (PRINIVIL,ZESTRIL) 10 MG tablet TAKE ONE TABLET BY MOUTH ONCE DAILY. 30 tablet 1  . nicotine (NICODERM CQ - DOSED IN MG/24 HOURS) 14 mg/24hr patch Take 1 patch (14 mg) to skin daily for 6 weeks 21 patch 1  . nicotine (NICODERM CQ) 7 mg/24hr patch After taking 14 mg dose for 6 weeks,begin 7 mg patch patch for 2 weeks and then stop 14 patch 0  . sildenafil (VIAGRA) 50 MG tablet Take 1 tablet (50 mg total) by mouth daily as needed. 5 tablet 11   No current facility-administered medications for this visit.     Past Surgical History    Procedure Laterality Date  . Skin lesion excision    . Circumcision       No Known Allergies    No family history on file.   Social History [name] reports that he has been smoking Cigarettes.  He has been smoking about 0.50 packs per day. He has never used smokeless tobacco. [name] reports that he does not drink alcohol.   Review of Systems CONSTITUTIONAL: No weight loss, fever, chills, weakness or fatigue.  HEENT: Eyes: No visual loss, blurred vision, double vision or yellow sclerae.No hearing loss, sneezing, congestion, runny nose or sore throat.  SKIN: No rash or itching.  CARDIOVASCULAR: per HPI RESPIRATORY: No shortness of breath, cough or sputum.  GASTROINTESTINAL: No anorexia, nausea, vomiting or diarrhea. No abdominal pain or blood.  GENITOURINARY: No burning on urination, no polyuria NEUROLOGICAL: No headache, dizziness, syncope, paralysis, ataxia, numbness or tingling in the extremities. No change in bowel or bladder control.  MUSCULOSKELETAL: No muscle, back pain, joint pain or stiffness.  LYMPHATICS: No enlarged nodes. No history of splenectomy.  PSYCHIATRIC: No history of depression or anxiety.  ENDOCRINOLOGIC: No reports of sweating, cold or heat intolerance. No polyuria or polydipsia.  Marland Kitchen   Physical Examination Filed Vitals:   11/12/14 1623  BP: 104/60  Pulse:  90   Filed Vitals:   11/12/14 1623  Height: 5\' 9"  (1.753 m)  Weight: 151 lb (68.493 kg)    Gen: resting comfortably, no acute distress HEENT: no scleral icterus, pupils equal round and reactive, no palptable cervical adenopathy,  CV: RRR, no m/r/g, no JVD Resp: Clear to auscultation bilaterally GI: abdomen is soft, non-tender, non-distended, normal bowel sounds, no hepatosplenomegaly MSK: extremities are warm, no edema.  Skin: warm, no rash Neuro:  no focal deficits Psych: appropriate affect   Diagnostic Studies  07/2008 Cath RESULTS: Aortic pressure was 115/54 with mean of 85.  Left index  pressure was 115/10.  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  Left anterior descending artery: The left anterior descending artery  gave rise to a large diagonal branch, septal perforator, and 2 small  diagonal branches. There was 40% narrowing in the proximal LAD after  the diagonal branch and there was 50% narrowing in the very proximal and  ostium of the first diagonal branch. There was moderate calcification  and no irregularities.  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 This was a small vessel that was diffusely  diseased with 70-80% stenoses in its proximal to midportion. It filled  2 small marginal branches. This vessel was also significantly diseased  on the prior study.  Right coronary artery: The right coronary artery was a moderately large  vessel that gave rise to a posterior descending branch and a large  posterolateral branch. There were multiple right angle turns in the  proximal vessel and there was a long stent in the midvessel which  traversed to a right angle bend. There was a 90% focal stenosis just  before the second bend and the midvessel. There was also 70-80%  narrowing in the distal right coronary artery which extended just to the  posterior descending branch. There was 60% narrowing in the  posterolateral branch.  Left ventriculogram: The left ventriculogram performed in the RAO  projection showed hypokinesis of the inferobasal segment. The overall  wall motion was good with an estimated ejection fraction of 60%.  Following cutting balloon angioplasty, the lesion within the stent in  the mid-right coronary stenosis improved from 90% to 10%.  Following stenting of the lesion, the distal right coronary stenosis  improved from 80% to 0%..  CONCLUSION:  1. 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  2. Successful percutaneous coronary intervention of the in-stent  restenotic lesion in the mid-right coronary using a cutting balloon  angioplasty with improvement in central narrowing from 90% to 10%.  3. Successful percutaneous coronary intervention of the lesion in the  distal right coronary using a Xience drug-eluting stent with  improvement in central narrowing from 80% to 0%.  DISPOSITION: [facility]  [facility]ation. Recommend Plavix for at least a year.     10/19/13 Clinic EKG NSR  10/2013 AAA US IMPRESSION: No evidence of abdominal aortic aneurysm .   Assessment and  Plan  1. CAD - no current symptoms - continue risk factor modification and secondary prevention  2. HTN - at goal, continue current meds  3. Hyperlipidemia - will order lipid panel, continue current statin.    F/u 1 year. Will order annual labs today.    Antoine Poche, M.D.

## 2014-11-12 NOTE — Patient Instructions (Signed)
Your physician wants you to follow-up in: 1 year with Dr Lurena JoinerBranch You will receive a reminder letter in the mail two months in advance. If you don't receive a letter, please call our office to schedule the follow-up appointment.  Your physician recommends that you continue on your current medications as directed. Please refer to the Current Medication list given to you today.    FASTING lab work- lab slip provided    Thank you for choosing [facility] !

## 2014-12-11 ENCOUNTER — Other Ambulatory Visit: Payer: Self-pay | Admitting: Cardiovascular Disease

## 2014-12-18 LAB — CBC
HCT: 42.1 % (ref 39.0–52.0)
Hemoglobin: 14.2 g/dL (ref 13.0–17.0)
MCH: 31.6 pg (ref 26.0–34.0)
MCHC: 33.7 g/dL (ref 30.0–36.0)
MCV: 93.8 fL (ref 78.0–100.0)
MPV: 9.5 fL (ref 8.6–12.4)
Platelets: 204 10*3/uL (ref 150–400)
RBC: 4.49 MIL/uL (ref 4.22–5.81)
RDW: 14.2 % (ref 11.5–15.5)
WBC: 8.8 10*3/uL (ref 4.0–10.5)

## 2014-12-18 LAB — HEMOGLOBIN A1C
HEMOGLOBIN A1C: 5.8 % — AB (ref <5.7)
Mean Plasma Glucose: 120 mg/dL — ABNORMAL HIGH (ref <117)

## 2014-12-19 LAB — LIPID PANEL
CHOL/HDL RATIO: 3.5 ratio
Cholesterol: 155 mg/dL (ref 0–200)
HDL: 44 mg/dL (ref >=40)
LDL CALC: 59 mg/dL (ref 0–99)
Triglycerides: 259 mg/dL — ABNORMAL HIGH (ref <150)
VLDL: 52 mg/dL — ABNORMAL HIGH (ref 0–40)

## 2014-12-19 LAB — COMPREHENSIVE METABOLIC PANEL
ALT: 23 U/L (ref 0–53)
AST: 21 U/L (ref 0–37)
Albumin: 4.2 g/dL (ref 3.5–5.2)
Alkaline Phosphatase: 42 U/L (ref 39–117)
BILIRUBIN TOTAL: 0.5 mg/dL (ref 0.2–1.2)
BUN: 13 mg/dL (ref 6–23)
CHLORIDE: 100 meq/L (ref 96–112)
CO2: 24 meq/L (ref 19–32)
Calcium: 9 mg/dL (ref 8.4–10.5)
Creat: 0.77 mg/dL (ref 0.50–1.35)
GLUCOSE: 87 mg/dL (ref 70–99)
POTASSIUM: 4.8 meq/L (ref 3.5–5.3)
SODIUM: 135 meq/L (ref 135–145)
Total Protein: 6.7 g/dL (ref 6.0–8.3)

## 2014-12-19 LAB — TSH: TSH: 0.588 u[IU]/mL (ref 0.350–4.500)

## 2014-12-19 LAB — MAGNESIUM: Magnesium: 1.8 mg/dL (ref 1.5–2.5)

## 2015-05-10 ENCOUNTER — Other Ambulatory Visit: Payer: Self-pay | Admitting: Cardiology

## 2015-07-29 IMAGING — US US AORTA
1 series · 14 of 23 positions shown · non-contrast
Comparison: None.

CLINICAL DATA: Coronary artery disease.  Smoking history.

EXAM:
ULTRASOUND OF ABDOMINAL AORTA
TECHNIQUE: Ultrasound examination of the abdominal aorta was performed to
evaluate for abdominal aortic aneurysm.

[Series 1: us aorta · 0.21mm/px · 14 of 23 slices shown]
[im 1/23]
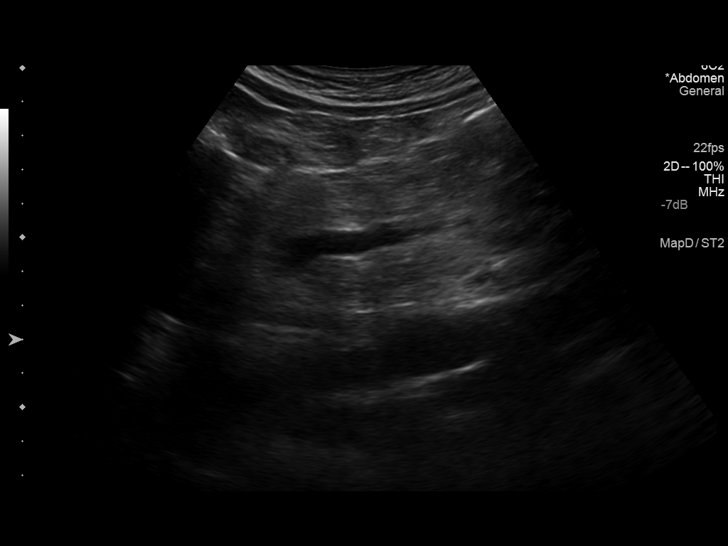
[im 3/23]
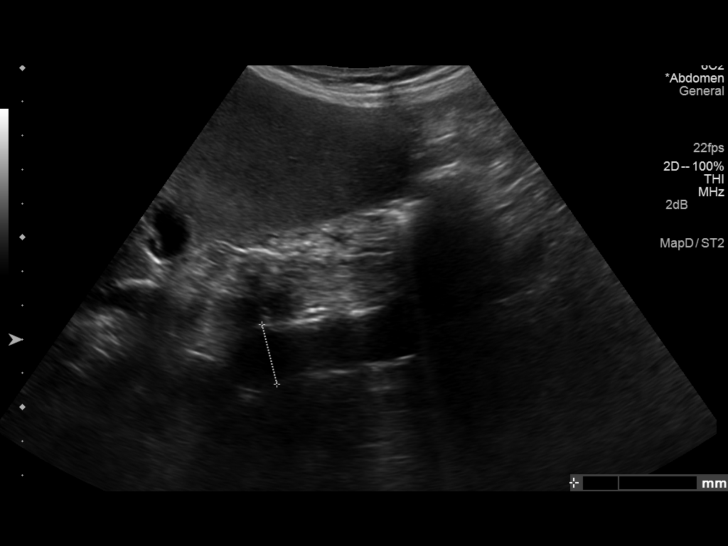
[im 5/23]
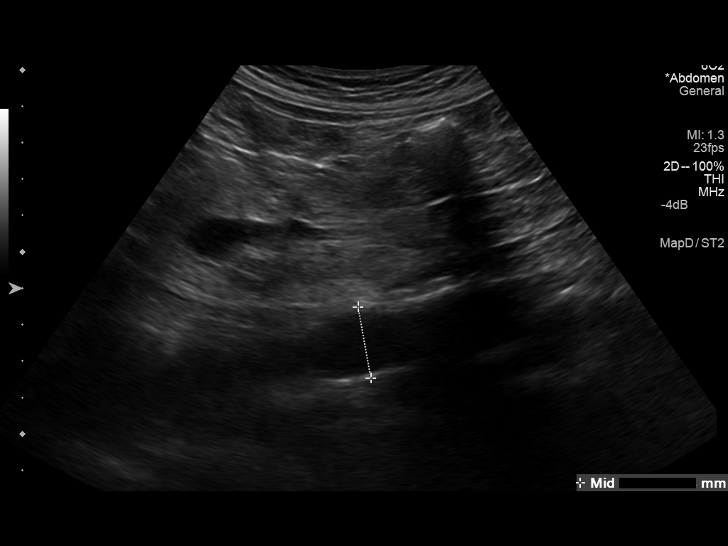
[im 6/23]
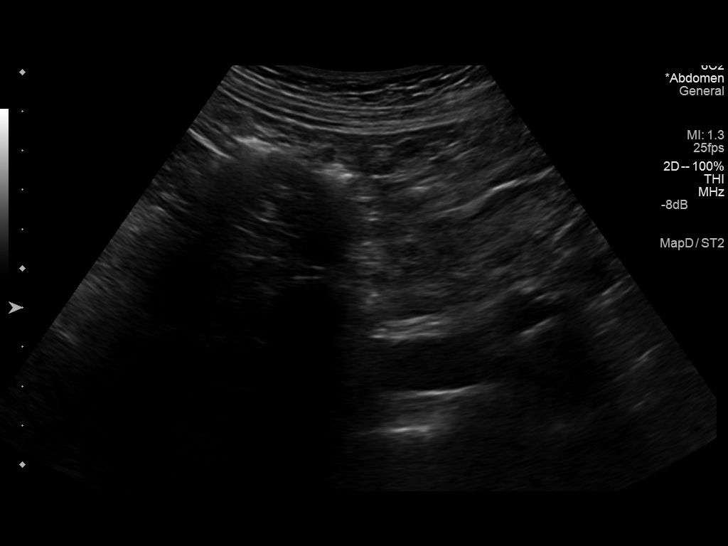
[im 8/23]
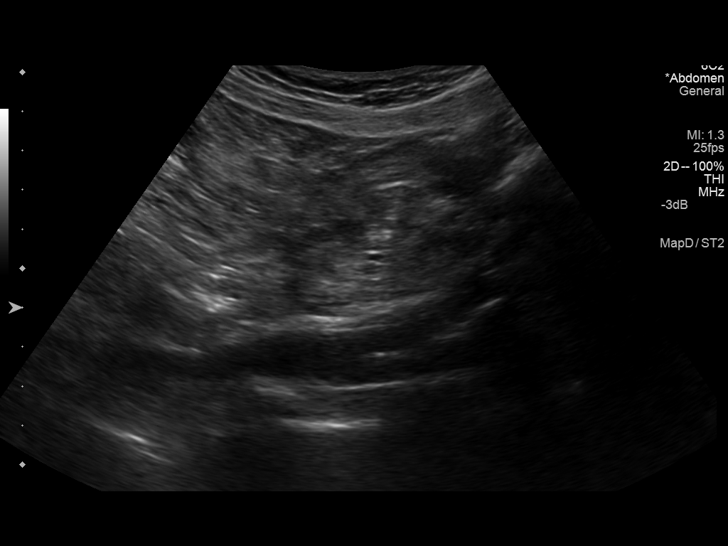
[im 10/23]
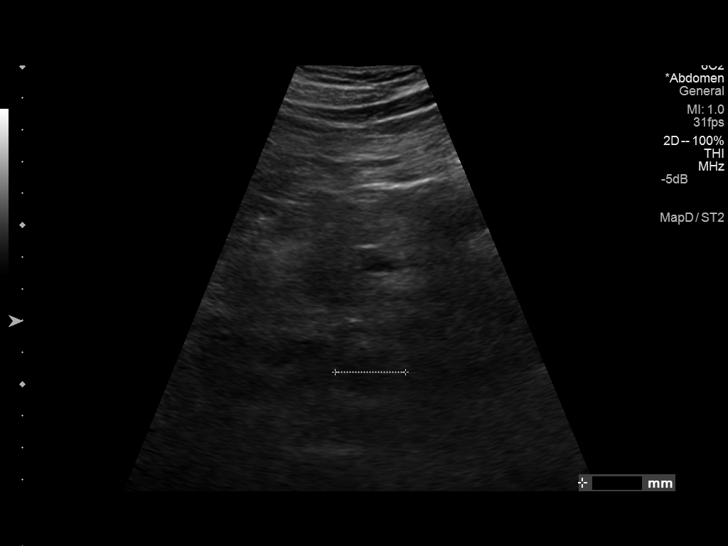
[im 11/23]
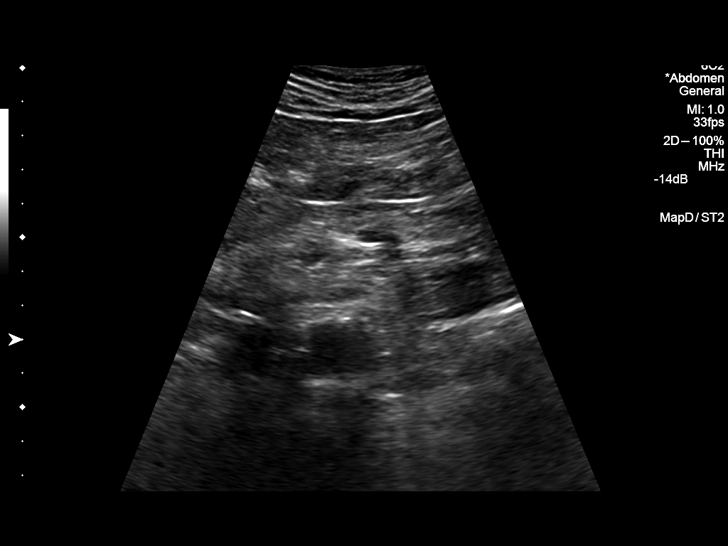
[im 13/23]
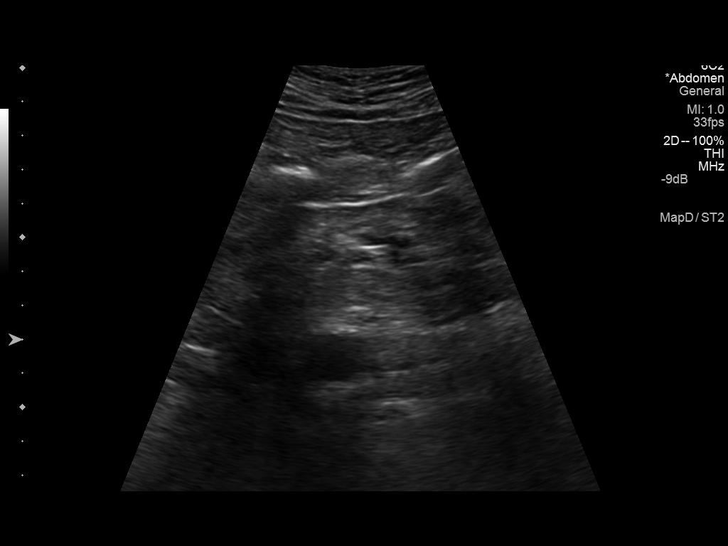
[im 14/23]
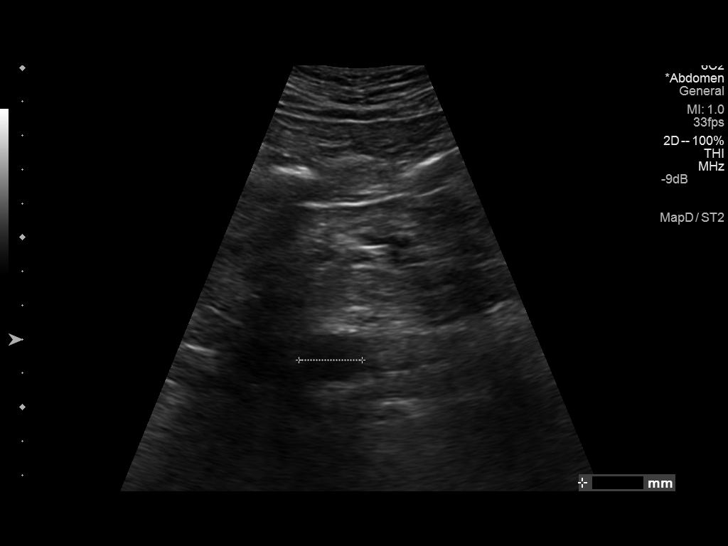
[im 16/23]
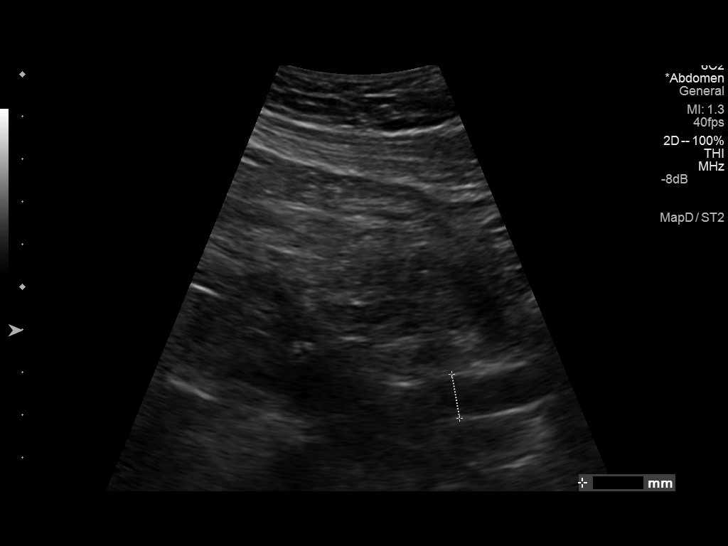
[im 18/23]
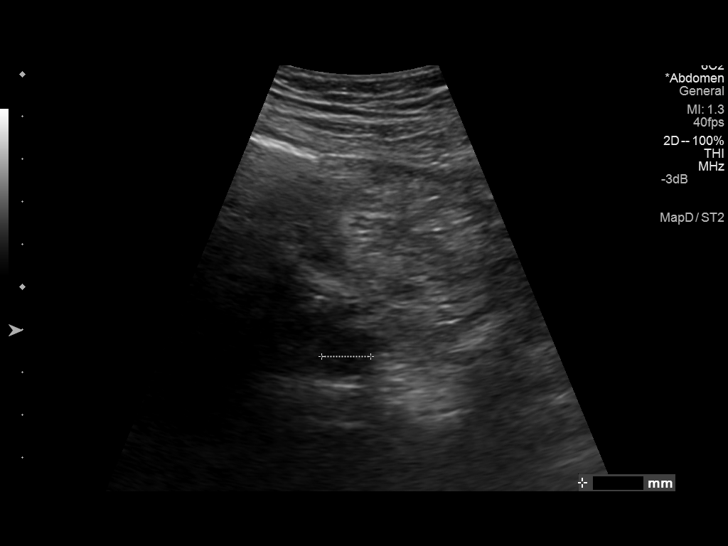
[im 19/23]
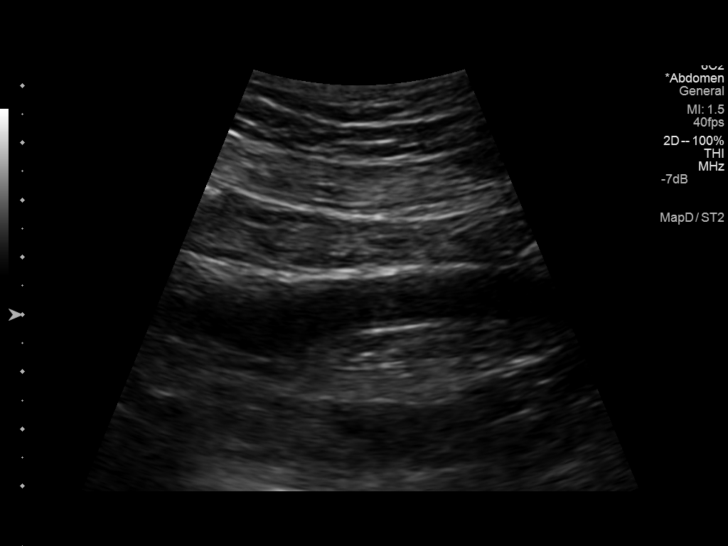
[im 21/23]
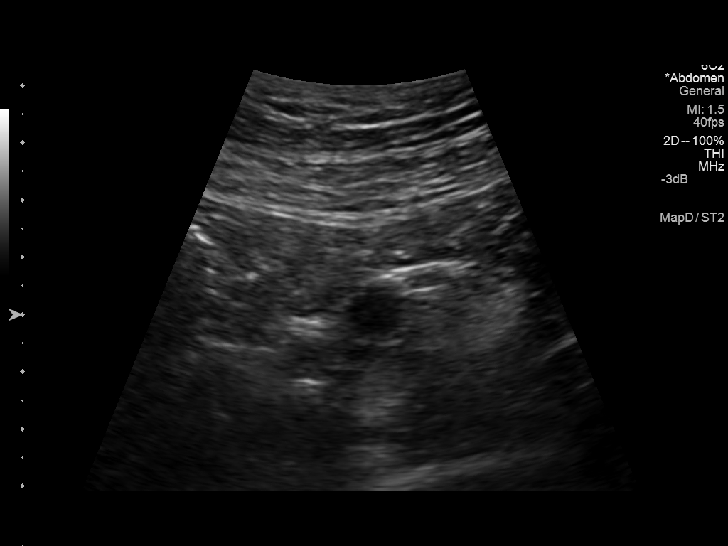
[im 23/23]
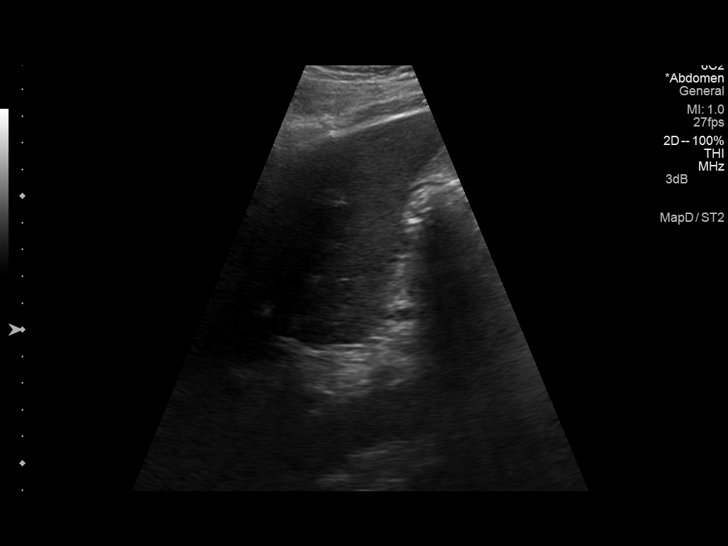

[14 of 23 positions shown; findings below may reference images not displayed]

FINDINGS: Abdominal Aorta

No aneurysm identified.

Maximum AP

Diameter:  2.0 cm

Maximum TRV

Diameter: 2.2 cm
IMPRESSION: No evidence of abdominal aortic aneurysm .

## 2015-10-14 ENCOUNTER — Other Ambulatory Visit: Payer: Self-pay | Admitting: Cardiology

## 2015-11-08 ENCOUNTER — Other Ambulatory Visit: Payer: Self-pay | Admitting: Cardiology

## 2015-12-03 ENCOUNTER — Encounter: Payer: Self-pay | Admitting: Cardiology

## 2015-12-03 ENCOUNTER — Ambulatory Visit (INDEPENDENT_AMBULATORY_CARE_PROVIDER_SITE_OTHER): Payer: BLUE CROSS/BLUE SHIELD | Admitting: Cardiology

## 2015-12-03 VITALS — BP 118/62 | HR 100 | Ht 69.0 in | Wt 148.0 lb

## 2015-12-03 DIAGNOSIS — I10 Essential (primary) hypertension: Secondary | ICD-10-CM

## 2015-12-03 DIAGNOSIS — E785 Hyperlipidemia, unspecified: Secondary | ICD-10-CM

## 2015-12-03 DIAGNOSIS — I2510 Atherosclerotic heart disease of native coronary artery without angina pectoris: Secondary | ICD-10-CM | POA: Diagnosis not present

## 2015-12-03 MED ORDER — VARENICLINE TARTRATE 0.5 MG X 11 & 1 MG X 42 PO MISC: ORAL | Status: DC

## 2015-12-03 NOTE — Patient Instructions (Signed)
Your physician wants you to follow-up in: 1 YEAR You will receive a reminder letter in the mail two months in advance. If you don't receive a letter, please call our office to schedule the follow-up appointment.    Get FASTING Lab Work    Take Chantix as directed      Thank you for choosing [facility] !

## 2015-12-03 NOTE — Progress Notes (Signed)
Clinical Summary [name] is a 70 y.o.male seen today for follow up of the following medical problems.   1. CAD - prior inferior MI in 2008, received BMS to RCA - NSTEMI 07/2008 due to RCA ISR, treated with cutting balloon and additional DES. LVEF 60% by LV gram at that time.   - no recent chest pain. No SOB or DOE - compliant with meds   2. HTN - does not check regularly at home - compliant with meds  3. Hyperlipidemia - 12/2014 TC 155 TG 259 HDL 44 LDL 59 - compliant with statin   4. Tobacco - long history of tobacco abuse - 10/2013 [place] AAA negative.     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 Tobacco abuse     60 pack years continuing at one pack per day   . Gout     acute gouty arthropathy  . Hyperlipidemia   . Hypertension   . Erectile dysfunction      No Known Allergies   Current Outpatient Prescriptions  Medication Sig Dispense Refill  . aspirin 81 MG tablet Take 81 mg by mouth daily.      Marland Kitchen atorvastatin (LIPITOR) 80 MG tablet TAKE ONE TABLET BY MOUTH ONCE DAILY AT BEDTIME. 30 tablet 3  . hydrochlorothiazide (HYDRODIURIL) 25 MG tablet TAKE ONE TABLET BY MOUTH ONCE DAILY. 30 tablet 0  . lisinopril (PRINIVIL,ZESTRIL) 10 MG tablet TAKE ONE TABLET BY MOUTH ONCE DAILY. 30 tablet 6  . nicotine (NICODERM CQ - DOSED IN MG/24 HOURS) 14 mg/24hr patch Take 1 patch (14 mg) to skin daily for 6 weeks (Patient not taking: Reported on 11/12/2014) 21 patch 1  . nicotine (NICODERM CQ) 7 mg/24hr patch After taking 14 mg dose for 6 weeks,begin 7 mg patch patch for 2 weeks and then stop (Patient not taking: Reported  on 11/12/2014) 14 patch 0  . sildenafil (VIAGRA) 50 MG tablet Take 1 tablet (50 mg total) by mouth daily as needed. 5 tablet 11   No current facility-administered medications for this visit.     Past Surgical History  Procedure Laterality Date  . Skin lesion excision    . Circumcision       No Known Allergies    No family history on file.   Social History Mr. Mcdonnell reports that he has been smoking Cigarettes.  He started smoking about 49 years ago. He has been smoking about 0.50 packs per day. He has never used smokeless tobacco. Mr. Mabus reports that he does not drink alcohol.   Review of Systems CONSTITUTIONAL: No weight loss, fever, chills, weakness or fatigue.  HEENT: Eyes: No visual loss, blurred vision, double vision or yellow sclerae.No hearing loss, sneezing, congestion, runny nose or sore throat.  SKIN: No rash or itching.  CARDIOVASCULAR: per HPI RESPIRATORY: No shortness of breath, cough or sputum.  GASTROINTESTINAL: No anorexia, nausea, vomiting or diarrhea. No abdominal pain or blood.  GENITOURINARY: No burning on urination, no polyuria NEUROLOGICAL: No headache, dizziness, syncope, paralysis, ataxia, numbness or tingling in the extremities. No change in bowel or bladder control.  MUSCULOSKELETAL: No muscle, back pain,  joint pain or stiffness.  LYMPHATICS: No enlarged nodes. No history of splenectomy.  PSYCHIATRIC: No history of depression or anxiety.  ENDOCRINOLOGIC: No reports of sweating, cold or heat intolerance. No polyuria or polydipsia.  Marland Kitchen   Physical Examination Filed Vitals:   12/03/15 1434  BP: 118/62  Pulse: 100   Filed Vitals:   12/03/15 1434  Height: 5\' 9"  (1.753 m)  Weight: 148 lb (67.132 kg)    Gen: resting comfortably, no acute distress HEENT: no scleral icterus, pupils equal round and reactive, no palptable cervical adenopathy,  CV: RRR, no m/rg, no jvd Resp: Clear to auscultation bilaterally GI: abdomen is soft, non-tender,  non-distended, normal bowel sounds, no hepatosplenomegaly MSK: extremities are warm, no edema.  Skin: warm, no rash Neuro:  no focal deficits Psych: appropriate affect   Diagnostic Studies 07/2008 Cath RESULTS: Aortic pressure was 115/54 with mean of 85. Left index  pressure was 115/10.  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  Left anterior descending artery: The left anterior descending artery  gave rise to a large diagonal Saul Fabiano, septal perforator, and 2 small  diagonal branches. There was 40% narrowing in the proximal LAD after  the diagonal Amyrah Pinkhasov and there was 50% narrowing in the very proximal and  ostium of the first diagonal Divit Stipp. There was moderate calcification  and no irregularities.  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 This was a small vessel that was diffusely  diseased with 70-80% stenoses in its proximal to midportion. It filled  2 small marginal branches. This vessel was also significantly diseased  on the prior study.  Right coronary artery: The right coronary artery was a moderately large  vessel that gave rise to a posterior descending Ambert Virrueta and a large  posterolateral Altariq Goodall. There were multiple right angle turns in the  proximal vessel and there was a long stent in the midvessel which  traversed to a right angle bend. There was a 90% focal stenosis just  before the second bend and the midvessel. There was also 70-80%  narrowing in the distal right coronary artery which extended just to the  posterior descending Edmund Holcomb. There was 60% narrowing in the  posterolateral Ethal Gotay.  Left ventriculogram: The left ventriculogram performed in the RAO  projection showed hypokinesis of the inferobasal segment. The overall  wall motion was good with an estimated ejection fraction of 60%.  Following cutting  balloon angioplasty, the lesion within the stent in  the mid-right coronary stenosis improved from 90% to 10%.  Following stenting of the lesion, the distal right coronary stenosis  improved from 80% to 0%..  CONCLUSION:  1. 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  2. Successful percutaneous coronary intervention of the in-stent  restenotic lesion in the mid-right coronary using a cutting balloon  angioplasty with improvement in central narrowing from 90% to 10%.  3. Successful percutaneous coronary intervention of the lesion in the  distal right coronary using a Xience drug-eluting stent with  improvement  in central narrowing from 80% to 0%.  [facility]  [facility]. Recommend Plavix for at least a year.     10/19/13 Clinic EKG NSR  10/2013 AAA US IMPRESSION: No evidence of abdominal aortic aneurysm .  Assessment and Plan   1. CAD/ASCVD - no current symptoms  EKG in clinic NSR, no ischemic changes - we will continue current meds  2. HTN - at goal, he will continue current meds  3. Hyperlipidemia - repeat lipid panel - continue current statin    F/u 1 year. We will order annual labs today.      Antoine PocheJonathan F. Bristyn Kulesza, M.D

## 2015-12-10 ENCOUNTER — Other Ambulatory Visit: Payer: Self-pay | Admitting: Cardiology

## 2015-12-22 ENCOUNTER — Other Ambulatory Visit: Payer: Self-pay | Admitting: Cardiology

## 2016-05-10 NOTE — Patient Instructions (Signed)
[name]  05/10/2016     @PREFPERIOPPHARMACY @   Your procedure is scheduled on 05/14/2016.  Report to [name] at 6:15 A.M.  Call this number if you have problems the morning of surgery:  [phone]   Remember:  Do not eat food or drink liquids after midnight.  Take these medicines the morning of surgery with A SIP OF WATER Lisinopril   Do not wear jewelry, make-up or nail polish.  Do not wear lotions, powders, or perfumes, or deoderant.  Do not shave 48 hours prior to surgery.  Men may shave face and neck.  Do not bring valuables to the hospital.  [facility] responsible for any belongings or valuables.  Contacts, dentures or bridgework may not be worn into surgery.  Leave your suitcase in the car.  After surgery it may be brought to your room.  For patients admitted to the hospital, discharge time will be determined by your treatment team.  Patients discharged the day of surgery will not be allowed to drive home.    Please read over the following fact sheets that you were given. Anesthesia Post-op Instructions     PATIENT INSTRUCTIONS POST-ANESTHESIA  IMMEDIATELY FOLLOWING SURGERY:  Do not drive or operate machinery for the first twenty four hours after surgery.  Do not make any important decisions for twenty four hours after surgery or while taking narcotic pain medications or sedatives.  If you develop intractable nausea and vomiting or a severe headache please notify your doctor immediately.  FOLLOW-UP:  Please make an appointment with your surgeon as instructed. You do not need to follow up with anesthesia unless specifically instructed to do so.  WOUND CARE INSTRUCTIONS (if applicable):  Keep a dry clean dressing on the anesthesia/puncture wound site if there is drainage.  Once the wound has quit draining you may leave it open to air.  Generally you should leave the bandage intact for twenty four hours unless there is drainage.  If the epidural site  drains for more than 36-48 hours please call the anesthesia department.  QUESTIONS?:  Please feel free to call your physician or the hospital operator if you have any questions, and they will be happy to assist you.      Cataract Surgery Cataract surgery is a procedure to remove a cataract from your eye. A cataract is cloudiness on the lens of your eye. The lens focuses light inside the eye. When a lens becomes cloudy, your vision is affected. Cataract surgery is a procedure to remove the cloudy lens. A substitute lens (intraocular lens or IOL) is usually inserted as a replacement for the cloudy lens. Tell a health care provider about:  Any allergies you have.  All medicines you are taking, including vitamins, herbs, eye drops, creams, and over-the-counter medicines.  Any problems you or family members have had with anesthetic medicines.  Any blood disorders you have.  Any surgeries you have had, especially eye surgeries that include refractive surgery, such as PRK and LASIK.  Any medical conditions you have.  Whether you are pregnant or may be pregnant. What are the risks? Generally, this is a safe procedure. However, problems may occur, including:  Infection.  Bleeding.  Glaucoma.  Retinal detachment.  Allergic reactions to medicines.  Damage to other structures or organs.  Inflammation of the eye.  Clouding of the part of your eye that holds an IOL in place (after-cataract), if an IOL was inserted. This is fairly common.  An IOL moving out of position, if an IOL was inserted. This is very rare.  Loss of vision. This is rare. What happens before the procedure?  Follow instructions from your health care provider about eating or drinking restrictions.  Ask your health care provider about:  Changing or stopping your regular medicines, including any eye drops you have been prescribed. This is especially important if you are taking diabetes medicines or blood  thinners.  Taking medicines such as aspirin and ibuprofen. These medicines can thin your blood. Do not take these medicines before your procedure if your health care provider instructs you not to.  Do not put contact lenses in either eye on the day of your surgery.  Plan for someone to drive you to and from the procedure.  If you will be going home right after the procedure, plan to have someone with you for 24 hours. What happens during the procedure?  An IV tube may be inserted into one of your veins.  You will be given one or more of the following:  A medicine to help you relax (sedative).  A medicine to numb the area (local anesthetic). This may be numbing eye drops or an injection that is given behind the eye.  A small cut (incision) will be made to the edge of the clear, dome-shaped surface that covers the front of the eye (cornea).  A small probe will be inserted into the eye. This device gives off ultrasound waves that soften and break up the cloudy center of the lens. This makes it easier for the cloudy lens to be removed by suction.  An IOL may be implanted.  Part of the capsule that surrounds the lens will be left in the eye to support the IOL.  Your surgeon may use stitches (sutures) to close the incision. The procedure may vary among health care providers and hospitals. What happens after the procedure?  Your blood pressure, heart rate, breathing rate, and blood oxygen level will be monitored often until the medicines you were given have worn off.  You may be given a protective shield to wear over your eyes.  Do not drive for 24 hours if you received a sedative. This information is not intended to replace advice given to you by your health care provider. Make sure you discuss any questions you have with your health care provider. Document Released: 05/20/2011 Document Revised: 11/06/2015 Document Reviewed: 04/10/2015 Elsevier Interactive Patient Education  2017  [employer].

## 2016-05-12 ENCOUNTER — Encounter (HOSPITAL_COMMUNITY): Payer: Self-pay

## 2016-05-12 ENCOUNTER — Encounter (HOSPITAL_COMMUNITY)
Admission: RE | Admit: 2016-05-12 | Discharge: 2016-05-12 | Disposition: A | Discharge status: Home / Self Care | Payer: BLUE CROSS/BLUE SHIELD | Source: Ambulatory Visit | Attending: Ophthalmology | Admitting: Ophthalmology

## 2016-05-12 DIAGNOSIS — I510 Cardiac septal defect, acquired: Secondary | ICD-10-CM | POA: Diagnosis not present

## 2016-05-12 DIAGNOSIS — I10 Essential (primary) hypertension: Secondary | ICD-10-CM | POA: Diagnosis not present

## 2016-05-12 DIAGNOSIS — I252 Old myocardial infarction: Secondary | ICD-10-CM | POA: Diagnosis not present

## 2016-05-12 DIAGNOSIS — H269 Unspecified cataract: Secondary | ICD-10-CM | POA: Diagnosis present

## 2016-05-12 HISTORY — DX: Unspecified osteoarthritis, unspecified site: M19.90

## 2016-05-12 LAB — BASIC METABOLIC PANEL
ANION GAP: 8 (ref 5–15)
BUN: 16 mg/dL (ref 6–20)
CO2: 25 mmol/L (ref 22–32)
Calcium: 9.5 mg/dL (ref 8.9–10.3)
Chloride: 103 mmol/L (ref 101–111)
Creatinine, Ser: 0.89 mg/dL (ref 0.61–1.24)
Glucose, Bld: 104 mg/dL — ABNORMAL HIGH (ref 65–99)
POTASSIUM: 5.1 mmol/L (ref 3.5–5.1)
SODIUM: 136 mmol/L (ref 135–145)

## 2016-05-12 LAB — CBC WITH DIFFERENTIAL/PLATELET
Basophils Absolute: 0.1 10*3/uL (ref 0.0–0.1)
Basophils Relative: 1 %
EOS ABS: 0.4 10*3/uL (ref 0.0–0.7)
EOS PCT: 5 %
HCT: 41 % (ref 39.0–52.0)
Hemoglobin: 13.9 g/dL (ref 13.0–17.0)
Lymphocytes Relative: 20 %
Lymphs Abs: 1.7 10*3/uL (ref 0.7–4.0)
MCH: 32.2 pg (ref 26.0–34.0)
MCHC: 33.9 g/dL (ref 30.0–36.0)
MCV: 94.9 fL (ref 78.0–100.0)
MONOS PCT: 6 %
Monocytes Absolute: 0.5 10*3/uL (ref 0.1–1.0)
NEUTROS ABS: 5.5 10*3/uL (ref 1.7–7.7)
Neutrophils Relative %: 68 %
PLATELETS: 260 10*3/uL (ref 150–400)
RBC: 4.32 MIL/uL (ref 4.22–5.81)
RDW: 13.9 % (ref 11.5–15.5)
WBC: 8.1 10*3/uL (ref 4.0–10.5)

## 2016-05-13 MED ORDER — LIDOCAINE HCL 3.5 % OP GEL
OPHTHALMIC | Status: AC
Start: 2016-05-13 — End: 2016-05-13
  Filled 2016-05-13: qty 1

## 2016-05-13 MED ORDER — NEOMYCIN-POLYMYXIN-DEXAMETH 3.5-10000-0.1 OP SUSP
OPHTHALMIC | Status: AC
  Filled 2016-05-13: qty 5

## 2016-05-13 MED ORDER — LIDOCAINE HCL (PF) 1 % IJ SOLN
INTRAMUSCULAR | Status: AC
  Filled 2016-05-13: qty 2

## 2016-05-13 MED ORDER — TETRACAINE HCL 0.5 % OP SOLN
OPHTHALMIC | Status: AC
  Filled 2016-05-13: qty 4

## 2016-05-13 MED ORDER — CYCLOPENTOLATE-PHENYLEPHRINE 0.2-1 % OP SOLN
OPHTHALMIC | Status: AC
  Filled 2016-05-13: qty 2

## 2016-05-13 MED ORDER — PHENYLEPHRINE HCL 2.5 % OP SOLN
OPHTHALMIC | Status: AC
  Filled 2016-05-13: qty 15

## 2016-05-14 ENCOUNTER — Encounter (HOSPITAL_COMMUNITY): Payer: Self-pay

## 2016-05-14 ENCOUNTER — Ambulatory Visit (HOSPITAL_COMMUNITY)
Admission: RE | Admit: 2016-05-14 | Discharge: 2016-05-14 | Disposition: A | Discharge status: Home / Self Care | Payer: BLUE CROSS/BLUE SHIELD | Source: Ambulatory Visit | Attending: Ophthalmology | Admitting: Ophthalmology

## 2016-05-14 ENCOUNTER — Ambulatory Visit (HOSPITAL_COMMUNITY): Payer: BLUE CROSS/BLUE SHIELD | Admitting: Anesthesiology

## 2016-05-14 ENCOUNTER — Encounter (HOSPITAL_COMMUNITY)
Admission: RE | Disposition: A | Discharge status: Home / Self Care | Payer: Self-pay | Source: Ambulatory Visit | Attending: Ophthalmology

## 2016-05-14 DIAGNOSIS — H269 Unspecified cataract: Secondary | ICD-10-CM | POA: Insufficient documentation

## 2016-05-14 DIAGNOSIS — I10 Essential (primary) hypertension: Secondary | ICD-10-CM | POA: Insufficient documentation

## 2016-05-14 DIAGNOSIS — I510 Cardiac septal defect, acquired: Secondary | ICD-10-CM | POA: Insufficient documentation

## 2016-05-14 DIAGNOSIS — I252 Old myocardial infarction: Secondary | ICD-10-CM | POA: Insufficient documentation

## 2016-05-14 HISTORY — PX: CATARACT EXTRACTION W/PHACO: SHX586

## 2016-05-14 SURGERY — PHACOEMULSIFICATION, CATARACT, WITH IOL INSERTION
Anesthesia: Monitor Anesthesia Care | Site: Eye | Laterality: Left

## 2016-05-14 MED ORDER — LIDOCAINE HCL (PF) 1 % IJ SOLN
INTRAMUSCULAR | Status: DC | PRN
  Administered 2016-05-14: .8 mL via OPHTHALMIC

## 2016-05-14 MED ORDER — POVIDONE-IODINE 5 % OP SOLN
OPHTHALMIC | Status: DC | PRN
  Administered 2016-05-14: 1 via OPHTHALMIC

## 2016-05-14 MED ORDER — EPINEPHRINE PF 1 MG/ML IJ SOLN
INTRAMUSCULAR | Status: AC
  Filled 2016-05-14: qty 1

## 2016-05-14 MED ORDER — EPINEPHRINE PF 1 MG/ML IJ SOLN
INTRAOCULAR | Status: DC | PRN
  Administered 2016-05-14: 500 mL

## 2016-05-14 MED ORDER — BSS IO SOLN
INTRAOCULAR | Status: DC | PRN
  Administered 2016-05-14: 15 mL

## 2016-05-14 MED ORDER — CYCLOPENTOLATE-PHENYLEPHRINE 0.2-1 % OP SOLN
1.0000 [drp] | OPHTHALMIC | Status: AC
  Administered 2016-05-14 (×3): 1 [drp] via OPHTHALMIC

## 2016-05-14 MED ORDER — LIDOCAINE HCL 3.5 % OP GEL
1.0000 "application " | Freq: Once | OPHTHALMIC | Status: AC
  Administered 2016-05-14: 1 via OPHTHALMIC

## 2016-05-14 MED ORDER — PHENYLEPHRINE HCL 2.5 % OP SOLN
1.0000 [drp] | OPHTHALMIC | Status: AC
  Administered 2016-05-14 (×3): 1 [drp] via OPHTHALMIC

## 2016-05-14 MED ORDER — FENTANYL CITRATE (PF) 100 MCG/2ML IJ SOLN
25.0000 ug | INTRAMUSCULAR | Status: DC | PRN
  Administered 2016-05-14: 25 ug via INTRAVENOUS
  Filled 2016-05-14: qty 2

## 2016-05-14 MED ORDER — MIDAZOLAM HCL 2 MG/2ML IJ SOLN
1.0000 mg | INTRAMUSCULAR | Status: DC | PRN
  Administered 2016-05-14: 2 mg via INTRAVENOUS
  Filled 2016-05-14: qty 2

## 2016-05-14 MED ORDER — LACTATED RINGERS IV SOLN
INTRAVENOUS | Status: DC
  Administered 2016-05-14 (×2): via INTRAVENOUS

## 2016-05-14 MED ORDER — TETRACAINE HCL 0.5 % OP SOLN
1.0000 [drp] | OPHTHALMIC | Status: AC
  Administered 2016-05-14 (×3): 1 [drp] via OPHTHALMIC

## 2016-05-14 MED ORDER — NEOMYCIN-POLYMYXIN-DEXAMETH 3.5-10000-0.1 OP SUSP
OPHTHALMIC | Status: DC | PRN
  Administered 2016-05-14: 2 [drp] via OPHTHALMIC

## 2016-05-14 MED ORDER — SODIUM HYALURONATE 23 MG/ML IO SOLN
INTRAOCULAR | Status: DC | PRN
  Administered 2016-05-14: 0.6 mL via INTRAOCULAR

## 2016-05-14 MED ORDER — LIDOCAINE HCL (PF) 1 % IJ SOLN
INTRAMUSCULAR | Status: AC
  Filled 2016-05-14: qty 2

## 2016-05-14 MED ORDER — PROVISC 10 MG/ML IO SOLN
INTRAOCULAR | Status: DC | PRN
  Administered 2016-05-14: 0.85 mL via INTRAOCULAR

## 2016-05-14 SURGICAL SUPPLY — 15 items
CLOTH BEACON ORANGE TIMEOUT ST (SAFETY) ×3 IMPLANT
EYE SHIELD UNIVERSAL CLEAR (GAUZE/BANDAGES/DRESSINGS) ×3 IMPLANT
GLOVE BIOGEL PI IND STRL 6.5 (GLOVE) ×1 IMPLANT
GLOVE BIOGEL PI IND STRL 7.0 (GLOVE) ×1 IMPLANT
GLOVE BIOGEL PI IND STRL 8 (GLOVE) ×1 IMPLANT
GLOVE BIOGEL PI INDICATOR 6.5 (GLOVE) ×2
GLOVE BIOGEL PI INDICATOR 7.0 (GLOVE) ×2
GLOVE BIOGEL PI INDICATOR 8 (GLOVE) ×2
LENS ALC ACRYL/TECN (Ophthalmic Related) ×3 IMPLANT
PAD ARMBOARD 7.5X6 YLW CONV (MISCELLANEOUS) ×3 IMPLANT
SYR TB 1ML LL NO SAFETY (SYRINGE) ×3 IMPLANT
TAPE SURG TRANSPORE 1 IN (GAUZE/BANDAGES/DRESSINGS) ×1 IMPLANT
TAPE SURGICAL TRANSPORE 1 IN (GAUZE/BANDAGES/DRESSINGS) ×2
VISCOELASTIC ADDITIONAL (OPHTHALMIC RELATED) ×3 IMPLANT
WATER STERILE IRR 250ML POUR (IV SOLUTION) ×3 IMPLANT

## 2016-05-14 NOTE — Discharge Instructions (Signed)
Please discharge patient when stable, will follow up today at 9:00AM with Dr. June [facility]Wrzosek at [facility].  Leave shield in place until visit.    Moderate Conscious Sedation, Adult, Care After These instructions provide you with information about caring for yourself after your procedure. Your health care provider may also give you more specific instructions. Your treatment has been planned according to current medical practices, but problems sometimes occur. Call your health care provider if you have any problems or questions after your procedure. What can I expect after the procedure? After your procedure, it is common:  To feel sleepy for several hours.  To feel clumsy and have poor balance for several hours.  To have poor judgment for several hours.  To vomit if you eat too soon. Follow these instructions at home: For at least 24 hours after the procedure:   Do not:  Participate in activities where you could fall or become injured.  Drive.  Use heavy machinery.  Drink alcohol.  Take sleeping pills or medicines that cause drowsiness.  Make important decisions or sign legal documents.  Take care of children on your own.  Rest. Eating and drinking  Follow the diet recommended by your health care provider.  If you vomit:  Drink water, juice, or soup when you can drink without vomiting.  Make sure you have little or no nausea before eating solid foods. General instructions  Have a responsible adult stay with you until you are awake and alert.  Take over-the-counter and prescription medicines only as told by your health care provider.  If you smoke, do not smoke without supervision.  Keep all follow-up visits as told by your health care provider. This is important. Contact a health care provider if:  You keep feeling nauseous or you keep vomiting.  You feel light-headed.  You develop a rash.  You have a fever. Get help right away if:  You  have trouble breathing. This information is not intended to replace advice given to you by your health care provider. Make sure you discuss any questions you have with your health care provider. Document Released: 03/21/2013 Document Revised: 11/03/2015 Document Reviewed: 09/20/2015 Elsevier Interactive Patient Education  2017 [employer]

## 2016-05-14 NOTE — Op Note (Signed)
Date of procedure:   Pre-operative diagnosis: Visually significant cataract, Left Eye  Post-operative diagnosis: Visually significant cataract, Left Eye  Procedure: Removal of cataract via phacoemulsification and insertion of intra-ocular lens AMO PCB00 +22.5D into the capsular bag of the Left Eye  Attending surgeon: Rudy JewJames A. Maheen Cwikla, MD, MA  Anesthesia: MAC, Topical Akten  Complications: None  Estimated Blood Loss: <201mL (minimal)  Specimens: None  Implants: As above  Indications:  Visually significant cataract, Left Eye  Procedure:  The patient was seen and identified in the pre-operative area. The operative eye was identified and dilated.  The operative eye was marked.  Topical anesthesia was administered to the operative eye.     The patient was then to the operative suite and placed in the supine position.  A timeout was performed confirming the patient, procedure to be performed, and all other relevant information.   The patient's face was prepped and draped in the usual fashion for intra-ocular surgery.  A lid speculum was placed into the operative eye and the surgical microscope moved into place and focused.  A superotemporal paracentesis was created using a 15 degree blade.  Shugarcaine was injected into the anterior chamber.  Viscoelastic was injected into the anterior chamber.  A temporal clear-corneal main wound incision was created using a 2.744mm microkeratome.  A continuous curvilinear capsulorrhexis was initiated using an irrigating cystitome and completed using capsulorrhexis forceps.  Hydrodissection and hydrodeliniation were performed.  Viscoelastic was injected into the anterior chamber.  A phacoemulsification handpiece and a chopper as a second instrument were used to remove the nucleus and epinucleus. The irrigation/aspiration handpiece was used to remove any remaining cortical material.   The capsular bag was reinflated with viscoelastic, checked, and found to be  intact. The AMO PCB00 intraocular lens was inserted into the capsular bag and dialed into place using a Sinskey hook.  The irrigation/aspiration handpiece was used to remove any remaining viscoelastic.  The clear corneal wound and paracentesis wounds were then hydrated and checked with Weck-Cels to be watertight.  The lid-speculum and drape was removed, and the patient's face was cleaned with a wet and dry 4x4.  Maxitrol was instilled in the eye before a clear shield was taped over the eye. The patient was taken to the post-operative care unit in good condition, having tolerated the procedure well.  Post-Op Instructions: The [facility] same day post-operative evaluation and will receive all other orders and instructions.

## 2016-05-14 NOTE — Transfer of Care (Signed)
Immediate Anesthesia Transfer of Care Note  Patient: [name]  Procedure(s) Performed: Procedure(s) with comments: CATARACT EXTRACTION PHACO AND INTRAOCULAR LENS PLACEMENT (IOC) (Left) - CDE: 12.39  Patient Location: Short Stay  Anesthesia Type:MAC  Level of Consciousness: awake, alert , oriented and patient cooperative  Airway & Oxygen Therapy: Patient Spontanous Breathing  Post-op Assessment: Report given to RN, Post -op Vital signs reviewed and stable and Patient moving all extremities  Post vital signs: Reviewed and stable  Last Vitals:  Vitals:   05/14/16 0720 05/14/16 0725  BP: 108/66 110/68  Pulse:    Resp: 15 15    Last Pain:  Vitals:   05/14/16 0636  TempSrc: Oral      Patients Stated Pain Goal: 7 (05/14/16 0636)  Complications: No apparent anesthesia complications

## 2016-05-14 NOTE — Progress Notes (Signed)
IV right forearm removed.

## 2016-05-14 NOTE — Anesthesia Postprocedure Evaluation (Signed)
Anesthesia Post Note  Patient: [name]  Procedure(s) Performed: Procedure(s) (LRB): CATARACT EXTRACTION PHACO AND INTRAOCULAR LENS PLACEMENT (IOC) (Left)  Patient location during evaluation: Short Stay Level of consciousness: awake and alert, oriented and patient cooperative Pain management: pain level controlled Vital Signs Assessment: post-procedure vital signs reviewed and stable Respiratory status: spontaneous breathing, nonlabored ventilation and respiratory function stable Cardiovascular status: blood pressure returned to baseline and stable Postop Assessment: no signs of nausea or vomiting Anesthetic complications: no    Last Vitals:  Vitals:   05/14/16 0720 05/14/16 0725  BP: 108/66 110/68  Pulse:    Resp: 15 15    Last Pain:  Vitals:   05/14/16 0636  TempSrc: Oral                 Baltazar Pekala J

## 2016-05-14 NOTE — H&P (Signed)
The H and P was reviewed and updated. The patient was examined.  No changes were found after exam.  The surgical eye was marked.  

## 2016-05-14 NOTE — Anesthesia Preprocedure Evaluation (Signed)
Anesthesia Evaluation  Patient identified by MRN, date of birth, ID band Patient awake    Reviewed: Allergy & Precautions, NPO status , Patient's Chart, lab work & pertinent test results  Airway Mallampati: II  TM Distance: >3 FB     Dental  (+) Partial Lower, Partial Upper   Pulmonary Current Smoker,    breath sounds clear to auscultation       Cardiovascular hypertension, Pt. on medications + CAD and + Past MI   Rhythm:Regular Rate:Normal     Neuro/Psych    GI/Hepatic   Endo/Other    Renal/GU      Musculoskeletal   Abdominal   Peds  Hematology   Anesthesia Other Findings   Reproductive/Obstetrics                             Anesthesia Physical Anesthesia Plan  ASA: III  Anesthesia Plan: MAC   Post-op Pain Management:    Induction: Intravenous  Airway Management Planned: Nasal Cannula  Additional Equipment:   Intra-op Plan:   Post-operative Plan:   Informed Consent: I have reviewed the patients History and Physical, chart, labs and discussed the procedure including the risks, benefits and alternatives for the proposed anesthesia with the patient or authorized representative who has indicated his/her understanding and acceptance.     Plan Discussed with:   Anesthesia Plan Comments:         Anesthesia Quick Evaluation

## 2016-05-18 ENCOUNTER — Encounter (HOSPITAL_COMMUNITY): Payer: Self-pay | Admitting: Ophthalmology

## 2016-05-25 ENCOUNTER — Encounter (HOSPITAL_COMMUNITY): Payer: BLUE CROSS/BLUE SHIELD

## 2016-06-11 ENCOUNTER — Other Ambulatory Visit: Payer: Self-pay

## 2016-06-11 ENCOUNTER — Encounter (HOSPITAL_COMMUNITY)
Admission: RE | Admit: 2016-06-11 | Discharge: 2016-06-11 | Disposition: A | Discharge status: Home / Self Care | Payer: BLUE CROSS/BLUE SHIELD | Source: Ambulatory Visit | Attending: Ophthalmology | Admitting: Ophthalmology

## 2016-06-11 MED ORDER — LISINOPRIL 10 MG PO TABS: 10.0000 mg | ORAL_TABLET | Freq: Every day | ORAL | 3 refills | Status: DC

## 2016-06-11 MED ORDER — ATORVASTATIN CALCIUM 80 MG PO TABS: 80.0000 mg | ORAL_TABLET | Freq: Every day | ORAL | 3 refills | Status: DC

## 2016-06-11 MED ORDER — HYDROCHLOROTHIAZIDE 25 MG PO TABS: 25.0000 mg | ORAL_TABLET | Freq: Every day | ORAL | 3 refills | Status: DC

## 2016-06-11 NOTE — Telephone Encounter (Signed)
refill complet

## 2016-06-15 ENCOUNTER — Encounter (HOSPITAL_COMMUNITY): Payer: Self-pay

## 2016-06-16 ENCOUNTER — Other Ambulatory Visit: Payer: Self-pay

## 2016-06-16 MED ORDER — ATORVASTATIN CALCIUM 80 MG PO TABS: 80.0000 mg | ORAL_TABLET | Freq: Every day | ORAL | 3 refills | Status: DC

## 2016-06-16 MED ORDER — LISINOPRIL 10 MG PO TABS: 10.0000 mg | ORAL_TABLET | Freq: Every day | ORAL | 3 refills | Status: DC

## 2016-06-16 MED ORDER — HYDROCHLOROTHIAZIDE 25 MG PO TABS: 25.0000 mg | ORAL_TABLET | Freq: Every day | ORAL | 3 refills | Status: DC

## 2016-06-16 NOTE — Progress Notes (Signed)
Sent in refills for Lipitor, Lisinopril & hydrochlorothiazide to [facility].

## 2016-06-25 NOTE — Patient Instructions (Signed)
[name]  06/25/2016     @PREFPERIOPPHARMACY @   Your procedure is scheduled on 07/02/2016.  Report to [facility] at 8:00 A.M.  Call this number if you have problems the morning of surgery:  [phone]   Remember:  Do not eat food or drink liquids after midnight.  Take these medicines the morning of surgery with A SIP OF WATER : Lisinopril  Do not wear jewelry, make-up or nail polish.  Do not wear lotions, powders, or perfumes, or deoderant.  Do not shave 48 hours prior to surgery.  Men may shave face and neck.  Do not bring valuables to the hospital.  [facility] is not responsible for any belongings or valuables.  Contacts, dentures or bridgework may not be worn into surgery.  Leave your suitcase in the car.  After surgery it may be brought to your room.  For patients admitted to the hospital, discharge time will be determined by your treatment team.  Patients discharged the day of surgery will not be allowed to drive home.   Name and phone number of your driver:   family Special instructions:  n/a  Please read over the following fact sheets that you were given. Care and Recovery After Surgery    Cataract Surgery Cataract surgery is a procedure to remove a cataract from your eye. A cataract is cloudiness on the lens of your eye. The lens focuses light inside the eye. When a lens becomes cloudy, your vision is affected. Cataract surgery is a procedure to remove the cloudy lens. A substitute lens (intraocular lens or IOL) is usually inserted as a replacement for the cloudy lens. Tell a health care provider about:  Any allergies you have.  All medicines you are taking, including vitamins, herbs, eye drops, creams, and over-the-counter medicines.  Any problems you or family members have had with anesthetic medicines.  Any blood disorders you have.  Any surgeries you have had, especially eye surgeries that include refractive surgery, such as PRK and LASIK.  Any  medical conditions you have.  Whether you are pregnant or may be pregnant. What are the risks? Generally, this is a safe procedure. However, problems may occur, including:  Infection.  Bleeding.  Glaucoma.  Retinal detachment.  Allergic reactions to medicines.  Damage to other structures or organs.  Inflammation of the eye.  Clouding of the part of your eye that holds an IOL in place (after-cataract), if an IOL was inserted. This is fairly common.  An IOL moving out of position, if an IOL was inserted. This is very rare.  Loss of vision. This is rare. What happens before the procedure?  Follow instructions from your health care provider about eating or drinking restrictions.  Ask your health care provider about:  Changing or stopping your regular medicines, including any eye drops you have been prescribed. This is especially important if you are taking diabetes medicines or blood thinners.  Taking medicines such as aspirin and ibuprofen. These medicines can thin your blood. Do not take these medicines before your procedure if your health care provider instructs you not to.  Do not put contact lenses in either eye on the day of your surgery.  Plan for someone to drive you to and from the procedure.  If you will be going home right after the procedure, plan to have someone with you for 24 hours. What happens during the procedure?  An IV tube may be inserted into one of your veins.  You will be given one or more of the following:  A medicine to help you relax (sedative).  A medicine to numb the area (local anesthetic). This may be numbing eye drops or an injection that is given behind the eye.  A small cut (incision) will be made to the edge of the clear, dome-shaped surface that covers the front of the eye (cornea).  A small probe will be inserted into the eye. This device gives off ultrasound waves that soften and break up the cloudy center of the lens. This makes  it easier for the cloudy lens to be removed by suction.  An IOL may be implanted.  Part of the capsule that surrounds the lens will be left in the eye to support the IOL.  Your surgeon may use stitches (sutures) to close the incision. The procedure may vary among health care providers and hospitals. What happens after the procedure?  Your blood pressure, heart rate, breathing rate, and blood oxygen level will be monitored often until the medicines you were given have worn off.  You may be given a protective shield to wear over your eyes.  Do not drive for 24 hours if you received a sedative. This information is not intended to replace advice given to you by your health care provider. Make sure you discuss any questions you have with your health care provider. Document Released: 05/20/2011 Document Revised: 11/06/2015 Document Reviewed: 04/10/2015 Elsevier Interactive Patient Education  2017 [employer].

## 2016-06-28 ENCOUNTER — Encounter (HOSPITAL_COMMUNITY)
Admission: RE | Admit: 2016-06-28 | Discharge: 2016-06-28 | Disposition: A | Discharge status: Home / Self Care | Payer: BLUE CROSS/BLUE SHIELD | Source: Ambulatory Visit | Attending: Ophthalmology | Admitting: Ophthalmology

## 2016-06-29 ENCOUNTER — Other Ambulatory Visit (HOSPITAL_COMMUNITY): Payer: BLUE CROSS/BLUE SHIELD

## 2016-06-29 ENCOUNTER — Other Ambulatory Visit: Payer: Self-pay

## 2016-06-29 ENCOUNTER — Encounter (HOSPITAL_COMMUNITY): Payer: Self-pay

## 2016-06-29 MED ORDER — LISINOPRIL 10 MG PO TABS: 10.0000 mg | ORAL_TABLET | Freq: Every day | ORAL | 3 refills | Status: DC

## 2016-06-29 MED ORDER — ATORVASTATIN CALCIUM 80 MG PO TABS: 80.0000 mg | ORAL_TABLET | Freq: Every day | ORAL | 3 refills | Status: DC

## 2016-06-29 MED ORDER — HYDROCHLOROTHIAZIDE 25 MG PO TABS: 25.0000 mg | ORAL_TABLET | Freq: Every day | ORAL | 3 refills | Status: DC

## 2016-07-02 ENCOUNTER — Ambulatory Visit (HOSPITAL_COMMUNITY): Admission: RE | Admit: 2016-07-02 | Payer: BLUE CROSS/BLUE SHIELD | Source: Ambulatory Visit | Admitting: Ophthalmology

## 2016-07-02 ENCOUNTER — Encounter (HOSPITAL_COMMUNITY): Admission: RE | Payer: Self-pay | Source: Ambulatory Visit

## 2016-07-02 SURGERY — PHACOEMULSIFICATION, CATARACT, WITH IOL INSERTION
Anesthesia: Monitor Anesthesia Care | Laterality: Right

## 2016-07-12 ENCOUNTER — Encounter (HOSPITAL_COMMUNITY)
Admission: RE | Admit: 2016-07-12 | Discharge: 2016-07-12 | Disposition: A | Discharge status: Home / Self Care | Payer: Medicare PPO | Source: Ambulatory Visit | Attending: Ophthalmology | Admitting: Ophthalmology

## 2016-07-12 ENCOUNTER — Encounter (HOSPITAL_COMMUNITY): Payer: Self-pay

## 2016-07-16 ENCOUNTER — Ambulatory Visit (HOSPITAL_COMMUNITY): Payer: Medicare PPO | Admitting: Anesthesiology

## 2016-07-16 ENCOUNTER — Encounter (HOSPITAL_COMMUNITY)
Admission: RE | Disposition: A | Discharge status: Home / Self Care | Payer: Self-pay | Source: Ambulatory Visit | Attending: Ophthalmology

## 2016-07-16 ENCOUNTER — Encounter (HOSPITAL_COMMUNITY): Payer: Self-pay | Admitting: *Deleted

## 2016-07-16 ENCOUNTER — Ambulatory Visit (HOSPITAL_COMMUNITY)
Admission: RE | Admit: 2016-07-16 | Discharge: 2016-07-16 | Disposition: A | Discharge status: Home / Self Care | Payer: Medicare PPO | Source: Ambulatory Visit | Attending: Ophthalmology | Admitting: Ophthalmology

## 2016-07-16 DIAGNOSIS — I10 Essential (primary) hypertension: Secondary | ICD-10-CM | POA: Diagnosis not present

## 2016-07-16 DIAGNOSIS — H269 Unspecified cataract: Secondary | ICD-10-CM | POA: Insufficient documentation

## 2016-07-16 DIAGNOSIS — I2510 Atherosclerotic heart disease of native coronary artery without angina pectoris: Secondary | ICD-10-CM | POA: Diagnosis not present

## 2016-07-16 DIAGNOSIS — F17210 Nicotine dependence, cigarettes, uncomplicated: Secondary | ICD-10-CM | POA: Insufficient documentation

## 2016-07-16 DIAGNOSIS — I252 Old myocardial infarction: Secondary | ICD-10-CM | POA: Insufficient documentation

## 2016-07-16 HISTORY — PX: CATARACT EXTRACTION W/PHACO: SHX586

## 2016-07-16 SURGERY — PHACOEMULSIFICATION, CATARACT, WITH IOL INSERTION
Anesthesia: Monitor Anesthesia Care | Site: Eye | Laterality: Right

## 2016-07-16 MED ORDER — LACTATED RINGERS IV SOLN
INTRAVENOUS | Status: DC
  Administered 2016-07-16: 11:00:00 via INTRAVENOUS

## 2016-07-16 MED ORDER — MIDAZOLAM HCL 2 MG/2ML IJ SOLN
INTRAMUSCULAR | Status: AC
  Filled 2016-07-16: qty 2

## 2016-07-16 MED ORDER — MIDAZOLAM HCL 5 MG/5ML IJ SOLN
INTRAMUSCULAR | Status: DC | PRN
  Administered 2016-07-16 (×2): 1 mg via INTRAVENOUS

## 2016-07-16 MED ORDER — NEOMYCIN-POLYMYXIN-DEXAMETH 3.5-10000-0.1 OP SUSP
OPHTHALMIC | Status: DC | PRN
  Administered 2016-07-16: 2 [drp] via OPHTHALMIC

## 2016-07-16 MED ORDER — SODIUM HYALURONATE 23 MG/ML IO SOLN
INTRAOCULAR | Status: DC | PRN
  Administered 2016-07-16: 0.6 mL via INTRAOCULAR

## 2016-07-16 MED ORDER — EPINEPHRINE PF 1 MG/ML IJ SOLN
INTRAMUSCULAR | Status: AC
  Filled 2016-07-16: qty 2

## 2016-07-16 MED ORDER — MIDAZOLAM HCL 2 MG/2ML IJ SOLN
1.0000 mg | INTRAMUSCULAR | Status: DC | PRN
  Administered 2016-07-16: 2 mg via INTRAVENOUS

## 2016-07-16 MED ORDER — PHENYLEPHRINE HCL 2.5 % OP SOLN
1.0000 [drp] | OPHTHALMIC | Status: AC
  Administered 2016-07-16 (×3): 1 [drp] via OPHTHALMIC

## 2016-07-16 MED ORDER — CYCLOPENTOLATE-PHENYLEPHRINE 0.2-1 % OP SOLN
1.0000 [drp] | OPHTHALMIC | Status: AC
  Administered 2016-07-16 (×3): 1 [drp] via OPHTHALMIC

## 2016-07-16 MED ORDER — EPINEPHRINE PF 1 MG/ML IJ SOLN
INTRAOCULAR | Status: DC | PRN
  Administered 2016-07-16: 500 mL

## 2016-07-16 MED ORDER — TETRACAINE HCL 0.5 % OP SOLN
1.0000 [drp] | OPHTHALMIC | Status: AC
  Administered 2016-07-16 (×3): 1 [drp] via OPHTHALMIC

## 2016-07-16 MED ORDER — POVIDONE-IODINE 5 % OP SOLN
OPHTHALMIC | Status: DC | PRN
  Administered 2016-07-16: 1 via OPHTHALMIC

## 2016-07-16 MED ORDER — LIDOCAINE HCL (PF) 1 % IJ SOLN
INTRAOCULAR | Status: DC | PRN
  Administered 2016-07-16: .9 mL via OPHTHALMIC

## 2016-07-16 MED ORDER — BSS IO SOLN
INTRAOCULAR | Status: DC | PRN
  Administered 2016-07-16: 15 mL

## 2016-07-16 MED ORDER — LIDOCAINE HCL 3.5 % OP GEL
1.0000 "application " | Freq: Once | OPHTHALMIC | Status: AC
  Administered 2016-07-16: 1 via OPHTHALMIC

## 2016-07-16 MED ORDER — PROVISC 10 MG/ML IO SOLN
INTRAOCULAR | Status: DC | PRN
  Administered 2016-07-16: 0.85 mL via INTRAOCULAR

## 2016-07-16 SURGICAL SUPPLY — 13 items
CLOTH BEACON ORANGE TIMEOUT ST (SAFETY) ×3 IMPLANT
EYE SHIELD UNIVERSAL CLEAR (GAUZE/BANDAGES/DRESSINGS) ×3 IMPLANT
GLOVE BIOGEL PI IND STRL 7.0 (GLOVE) ×1 IMPLANT
GLOVE BIOGEL PI IND STRL 8 (GLOVE) ×1 IMPLANT
GLOVE BIOGEL PI INDICATOR 7.0 (GLOVE) ×2
GLOVE BIOGEL PI INDICATOR 8 (GLOVE) ×2
LENS ALC ACRYL/TECN (Ophthalmic Related) ×3 IMPLANT
PAD ARMBOARD 7.5X6 YLW CONV (MISCELLANEOUS) ×3 IMPLANT
SYR TB 1ML LL NO SAFETY (SYRINGE) ×3 IMPLANT
TAPE SURG TRANSPORE 1 IN (GAUZE/BANDAGES/DRESSINGS) ×1 IMPLANT
TAPE SURGICAL TRANSPORE 1 IN (GAUZE/BANDAGES/DRESSINGS) ×2
VISCOELASTIC ADDITIONAL (OPHTHALMIC RELATED) ×3 IMPLANT
WATER STERILE IRR 250ML POUR (IV SOLUTION) ×3 IMPLANT

## 2016-07-16 NOTE — H&P (Signed)
The H and P was reviewed and updated. The patient was examined.  No changes were found after exam.  The surgical eye was marked.  

## 2016-07-16 NOTE — Anesthesia Preprocedure Evaluation (Signed)
Anesthesia Evaluation  Patient identified by MRN, date of birth, ID band Patient awake    Reviewed: Allergy & Precautions, NPO status , Patient's Chart, lab work & pertinent test results  Airway Mallampati: II  TM Distance: >3 FB     Dental  (+) Partial Lower, Partial Upper   Pulmonary Current Smoker,    breath sounds clear to auscultation       Cardiovascular hypertension, Pt. on medications + CAD, + Past MI and + Cardiac Stents   Rhythm:Regular Rate:Normal     Neuro/Psych    GI/Hepatic   Endo/Other    Renal/GU      Musculoskeletal   Abdominal   Peds  Hematology   Anesthesia Other Findings   Reproductive/Obstetrics                             Anesthesia Physical Anesthesia Plan  ASA: III  Anesthesia Plan: MAC   Post-op Pain Management:    Induction: Intravenous  Airway Management Planned: Nasal Cannula  Additional Equipment:   Intra-op Plan:   Post-operative Plan:   Informed Consent: I have reviewed the patients History and Physical, chart, labs and discussed the procedure including the risks, benefits and alternatives for the proposed anesthesia with the patient or authorized representative who has indicated his/her understanding and acceptance.     Plan Discussed with:   Anesthesia Plan Comments:         Anesthesia Quick Evaluation

## 2016-07-16 NOTE — Discharge Instructions (Addendum)
° °  Monitored Anesthesia Care, Care After °These instructions provide you with information about caring for yourself after your procedure. Your health care provider may also give you more specific instructions. Your treatment has been planned according to current medical practices, but problems sometimes occur. Call your health care provider if you have any problems or questions after your procedure. °What can I expect after the procedure? °After your procedure, it is common to: °· Feel sleepy for several hours. °· Feel clumsy and have poor balance for several hours. °· Feel forgetful about what happened after the procedure. °· Have poor judgment for several hours. °· Feel nauseous or vomit. °· Have a sore throat if you had a breathing tube during the procedure. °Follow these instructions at home: °For at least 24 hours after the procedure:  °· Do not: °¨ Participate in activities in which you could fall or become injured. °¨ Drive. °¨ Use heavy machinery. °¨ Drink alcohol. °¨ Take sleeping pills or medicines that cause drowsiness. °¨ Make important decisions or sign legal documents. °¨ Take care of children on your own. °· Rest. °Eating and drinking °· Follow the diet that is recommended by your health care provider. °· If you vomit, drink water, juice, or soup when you can drink without vomiting. °· Make sure you have little or no nausea before eating solid foods. °General instructions °· Have a responsible adult stay with you until you are awake and alert. °· Take over-the-counter and prescription medicines only as told by your health care provider. °· If you smoke, do not smoke without supervision. °· Keep all follow-up visits as told by your health care provider. This is important. °Contact a health care provider if: °· You keep feeling nauseous or you keep vomiting. °· You feel light-headed. °· You develop a rash. °· You have a fever. °Get help right away if: °· You have trouble breathing. °This information is  not intended to replace advice given to you by your health care provider. Make sure you discuss any questions you have with your health care provider. °Document Released: 09/21/2015 Document Revised: 01/21/2016 Document Reviewed: 09/21/2015 °Elsevier Interactive Patient Education © 2017 [employer] °Please discharge patient when stable, will follow up today with Dr. Wrzosek at the [facility] office.  Leave shield in place until visit.  All paperwork with discharge instructions will be given at the office. ° °

## 2016-07-16 NOTE — Anesthesia Postprocedure Evaluation (Signed)
Anesthesia Post Note  Patient: [name]  Procedure(s) Performed: Procedure(s) (LRB): CATARACT EXTRACTION PHACO AND INTRAOCULAR LENS PLACEMENT (IOC) (Right)  Patient location during evaluation: Short Stay Anesthesia Type: MAC Level of consciousness: awake and alert and oriented Pain management: pain level controlled Vital Signs Assessment: post-procedure vital signs reviewed and stable Cardiovascular status: stable Postop Assessment: no signs of nausea or vomiting Anesthetic complications: no     Last Vitals:  Vitals:   07/16/16 1046  Temp: 36.7 C    Last Pain: There were no vitals filed for this visit.               ADAMS, AMY A

## 2016-07-16 NOTE — Transfer of Care (Signed)
Immediate Anesthesia Transfer of Care Note  Patient: [name]  Procedure(s) Performed: Procedure(s) with comments: CATARACT EXTRACTION PHACO AND INTRAOCULAR LENS PLACEMENT (IOC) (Right) - CDE: 17.59  Patient Location: Short Stay  Anesthesia Type:MAC  Level of Consciousness: awake, alert , oriented and patient cooperative  Airway & Oxygen Therapy: Patient Spontanous Breathing  Post-op Assessment: Report given to RN and Post -op Vital signs reviewed and stable  Post vital signs: Reviewed and stable  Last Vitals:  Vitals:   07/16/16 1046  Temp: 36.7 C    Last Pain: There were no vitals filed for this visit.    Patients Stated Pain Goal: 8 (12/75/17 0017)  Complications: No apparent anesthesia complications

## 2016-07-16 NOTE — Op Note (Signed)
Date of procedure: 07/16/16  Pre-operative diagnosis: Visually significant cataract, Right Eye  Post-operative diagnosis: Visually significant cataract, Right Eye  Procedure: Removal of cataract via phacoemulsification and insertion of intra-ocular lens AMO PCB00  +22.5D into the capsular bag of the Right Eye  Attending surgeon: Gerda Diss. Charnice Zwilling, MD, MA  Anesthesia: MAC, Topical Akten  Complications: None  Estimated Blood Loss: <88m (minimal)  Specimens: None  Implants: As above  Indications:  Visually significant cataract, Right Eye  Procedure:  The patient was seen and identified in the pre-operative area. The operative eye was identified and dilated.  The operative eye was marked.  Topical anesthesia was administered to the operative eye.     The patient was then to the operative suite and placed in the supine position.  A timeout was performed confirming the patient, procedure to be performed, and all other relevant information.   The patient's face was prepped and draped in the usual fashion for intra-ocular surgery.  A lid speculum was placed into the operative eye and the surgical microscope moved into place and focused.  A superotemporal paracentesis was created using a 15 degree blade.  Shugarcaine was injected into the anterior chamber.  Viscoelastic was injected into the anterior chamber.  A temporal clear-corneal main wound incision was created using a 2.417mmicrokeratome.  A continuous curvilinear capsulorrhexis was initiated using an irrigating cystitome and completed using capsulorrhexis forceps.  Hydrodissection and hydrodeliniation were performed.  Viscoelastic was injected into the anterior chamber.  A phacoemulsification handpiece and a chopper as a second instrument were used to remove the nucleus and epinucleus. The irrigation/aspiration handpiece was used to remove any remaining cortical material.   The capsular bag was reinflated with viscoelastic, checked, and found  to be intact. An AMO PCB00 lens was inserted into the capsular bag and dialed into place using a Sinskey hook.  The irrigation/aspiration handpiece was used to remove any remaining viscoelastic.  The clear corneal wound and paracentesis wounds were then hydrated and checked with Weck-Cels to be watertight.  The lid-speculum and drape was removed, and the patient's face was cleaned with a wet and dry 4x4.  Maxitrol was instilled in the eye before a clear shield was taped over the eye. The patient was taken to the post-operative care unit in good condition, having tolerated the procedure well.  Post-Op Instructions: [facility] and will receive all other orders and instructions.

## 2016-07-16 NOTE — Anesthesia Procedure Notes (Signed)
Procedure Name: MAC Date/Time: 07/16/2016 11:47 AM Performed by: Andree Elk, AMY A Pre-anesthesia Checklist: Patient identified, Timeout performed, Emergency Drugs available, Suction available and Patient being monitored Oxygen Delivery Method: Nasal cannula

## 2016-07-19 ENCOUNTER — Encounter (HOSPITAL_COMMUNITY): Payer: Self-pay | Admitting: Ophthalmology

## 2016-12-06 ENCOUNTER — Ambulatory Visit: Payer: BLUE CROSS/BLUE SHIELD | Admitting: Cardiology

## 2016-12-08 ENCOUNTER — Ambulatory Visit (INDEPENDENT_AMBULATORY_CARE_PROVIDER_SITE_OTHER): Payer: Medicare PPO | Admitting: Cardiology

## 2016-12-08 ENCOUNTER — Encounter: Payer: Self-pay | Admitting: Cardiology

## 2016-12-08 VITALS — BP 131/76 | HR 67 | Ht 69.0 in | Wt 142.0 lb

## 2016-12-08 DIAGNOSIS — I10 Essential (primary) hypertension: Secondary | ICD-10-CM

## 2016-12-08 DIAGNOSIS — E785 Hyperlipidemia, unspecified: Secondary | ICD-10-CM | POA: Diagnosis not present

## 2016-12-08 DIAGNOSIS — R7309 Other abnormal glucose: Secondary | ICD-10-CM

## 2016-12-08 DIAGNOSIS — I2510 Atherosclerotic heart disease of native coronary artery without angina pectoris: Secondary | ICD-10-CM

## 2016-12-08 NOTE — Patient Instructions (Signed)
Your physician wants you to follow-up in: 1 Year with Dr. Branch. You will receive a reminder letter in the mail two months in advance. If you don't receive a letter, please call our office to schedule the follow-up appointment.  Your physician recommends that you continue on your current medications as directed. Please refer to the Current Medication list given to you today.  Your physician recommends that you return for lab work in: Fasting  If you need a refill on your cardiac medications before your next appointment, please call your pharmacy.  Thank you for choosing [facility]!   

## 2016-12-08 NOTE — Progress Notes (Signed)
Clinical Summary Mr. Rinker is a 71 y.o.male seen today for follow up of the following medical problems.   1. CAD - prior inferior MI in 2008, received BMS to RCA - NSTEMI 07/2008 due to RCA ISR, treated with cutting balloon and additional DES. LVEF 60% by LV gram at that time.   - denies any chest pain. No SOB or DOE, though can have some allergies - compliant with meds  2. HTN - does not check regularly at home - compliant with meds  3. Hyperlipidemia - compliant with statin     SH: works [employer] for 42 years. Recently retired.    Past Medical History:  Diagnosis Date  . 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 Arthritis   . Erectile dysfunction   . Gout    acute gouty arthropathy  . Hyperlipidemia   . Hypertension   . Tobacco abuse    60 pack years continuing at one pack per day      No Known Allergies   Current Outpatient Prescriptions  Medication Sig Dispense Refill  . aspirin 81 MG tablet Take 81 mg by mouth daily.      Marland Kitchen atorvastatin (LIPITOR) 80 MG tablet Take 1 tablet (80 mg total) by mouth daily with breakfast. 90 tablet 3  . hydrochlorothiazide (HYDRODIURIL) 25 MG tablet Take 1 tablet (25 mg total) by mouth daily. 90 tablet 3  . ibuprofen (ADVIL,MOTRIN) 800 MG tablet Take 400 mg by mouth daily as needed for moderate pain.    Marland Kitchen lisinopril (PRINIVIL,ZESTRIL) 10 MG tablet Take 1 tablet (10 mg total) by mouth daily. 90 tablet 3  . sildenafil (VIAGRA) 50 MG tablet Take 1 tablet (50 mg total) by mouth daily as needed. 5 tablet 11  . varenicline (CHANTIX STARTING MONTH PAK) 0.5 MG X 11 & 1 MG X 42 tablet Take one 0.5 mg tablet by mouth once daily for 3 days, then  increase to one 0.5 mg tablet twice daily for 4 days, then increase to one 1 mg tablet twice daily for 11 weeks 53 tablet 0   No current facility-administered medications for this visit.      Past Surgical History:  Procedure Laterality Date  . CARDIAC CATHETERIZATION     2 stents inserted  . CATARACT EXTRACTION W/PHACO Left 05/14/2016   Procedure: CATARACT EXTRACTION PHACO AND INTRAOCULAR LENS PLACEMENT (IOC);  Surgeon: Fabio Pierce, MD;  Location: [facility];  Service: Ophthalmology;  Laterality: Left;  CDE: 12.39  . CATARACT EXTRACTION W/PHACO Right 07/16/2016   Procedure: CATARACT EXTRACTION PHACO AND INTRAOCULAR LENS PLACEMENT (IOC);  Surgeon: Fabio Pierce, MD;  Location: [facility];  Service: Ophthalmology;  Laterality: Right;  CDE: 17.59  . CIRCUMCISION    . SKIN LESION EXCISION Left    left leg and back.     No Known Allergies    No family history on file.   Social History Mr. Spooner reports that he has been smoking Cigarettes.  He started smoking about 50 years ago. He has a 20.00 pack-year smoking history. He has never used smokeless tobacco. Mr. Matters reports that he drinks about 3.0 oz of alcohol per week .   Review of Systems CONSTITUTIONAL: No weight loss, fever, chills,  weakness or fatigue.  HEENT: Eyes: No visual loss, blurred vision, double vision or yellow sclerae.No hearing loss, sneezing, congestion, runny nose or sore throat.  SKIN: No rash or itching.  CARDIOVASCULAR: per hpi RESPIRATORY: No shortness of breath, cough or sputum.  GASTROINTESTINAL: No anorexia, nausea, vomiting or diarrhea. No abdominal pain or blood.  GENITOURINARY: No burning on urination, no polyuria NEUROLOGICAL: No headache, dizziness, syncope, paralysis, ataxia, numbness or tingling in the extremities. No change in bowel or bladder control.  MUSCULOSKELETAL: No muscle, back pain, joint pain or stiffness.  LYMPHATICS: No enlarged nodes. No history of splenectomy.  PSYCHIATRIC: No history  of depression or anxiety.  ENDOCRINOLOGIC: No reports of sweating, cold or heat intolerance. No polyuria or polydipsia.  Marland Kitchen   Physical Examination Vitals:   12/08/16 0912  BP: 131/76  Pulse: 67   Vitals:   12/08/16 0912  Weight: 142 lb (64.4 kg)  Height: 5\' 9"  (1.753 m)    Gen: resting comfortably, no acute distress HEENT: no scleral icterus, pupils equal round and reactive, no palptable cervical adenopathy,  CV: RRR, no m/r/g, no jvd Resp: Clear to auscultation bilaterally GI: abdomen is soft, non-tender, non-distended, normal bowel sounds, no hepatosplenomegaly MSK: extremities are warm, no edema.  Skin: warm, no rash Neuro:  no focal deficits Psych: appropriate affect   Diagnostic Studies 07/2008 Cath RESULTS: Aortic pressure was 115/54 with mean of 85. Left index  pressure was 115/10.  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  Left anterior descending artery: The left anterior descending artery  gave rise to a large diagonal Vicki Chaffin, septal perforator, and 2 small  diagonal branches. There was 40% narrowing in the proximal LAD after  the diagonal Tammatha Cobb and there was 50% narrowing in the very proximal and  ostium of the first diagonal Arrin Pintor. There was moderate calcification  and no irregularities.  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 This was a small vessel that was diffusely  diseased with 70-80% stenoses in its proximal to midportion. It filled  2 small marginal branches. This vessel was also significantly diseased  on the prior study.  Right coronary artery: The right coronary artery was a moderately large  vessel that gave rise to a posterior descending Altin Sease and a large  posterolateral Kenard Morawski. There were multiple right angle turns in the  proximal vessel and there was a long stent in the midvessel which   traversed to a right angle bend. There was a 90% focal stenosis just  before the second bend and the midvessel. There was also 70-80%  narrowing in the distal right coronary artery which extended just to the  posterior descending Sabrina Keough. There was 60% narrowing in the  posterolateral Vesta Wheeland.  Left ventriculogram: The left ventriculogram performed in the RAO  projection showed hypokinesis of the inferobasal segment. The overall  wall motion was good with an estimated ejection fraction of 60%.  Following cutting balloon angioplasty, the lesion within the stent in  the mid-right coronary stenosis improved from 90% to 10%.  Following stenting of the lesion, the distal right coronary stenosis  improved from 80% to 0%..  CONCLUSION:  1. 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  2. Successful percutaneous coronary intervention of the in-stent  restenotic lesion in the mid-right coronary using a cutting balloon  angioplasty with improvement in central narrowing from 90% to 10%.  3. Successful percutaneous coronary intervention of the lesion in the  distal right coronary using a Xience drug-eluting stent with  improvement in central narrowing from 80% to 0%.  [facility]: [facility]. Recommend Plavix for at least a year.     10/19/13 Clinic EKG NSR  10/2013 AAA US IMPRESSION: No evidence of abdominal aortic aneurysm .    Assessment and Plan  1. CAD/ASCVD - no recent symptoms - we  will continue current meds  2. HTN - bp is at goal,  continue current meds  3. Hyperlipidemia - continue current statin - repeat labs    F/u 1 year. Order annual labs      Antoine PocheJonathan F. Adiya Selmer, M.D

## 2017-09-26 ENCOUNTER — Other Ambulatory Visit: Payer: Self-pay | Admitting: Cardiology

## 2017-12-07 ENCOUNTER — Encounter: Payer: Self-pay | Admitting: Cardiology

## 2018-01-10 ENCOUNTER — Encounter: Payer: Self-pay | Admitting: Cardiology

## 2018-01-19 ENCOUNTER — Ambulatory Visit: Payer: Medicare PPO | Admitting: Cardiology

## 2018-01-20 ENCOUNTER — Ambulatory Visit: Payer: Medicare PPO | Admitting: Cardiology

## 2018-01-30 ENCOUNTER — Encounter: Payer: Self-pay | Admitting: Cardiology

## 2018-01-30 ENCOUNTER — Ambulatory Visit (INDEPENDENT_AMBULATORY_CARE_PROVIDER_SITE_OTHER): Payer: Medicare PPO | Admitting: Cardiology

## 2018-01-30 VITALS — BP 122/60 | HR 92 | Ht 68.0 in | Wt 143.0 lb

## 2018-01-30 DIAGNOSIS — I2510 Atherosclerotic heart disease of native coronary artery without angina pectoris: Secondary | ICD-10-CM

## 2018-01-30 DIAGNOSIS — I10 Essential (primary) hypertension: Secondary | ICD-10-CM | POA: Diagnosis not present

## 2018-01-30 DIAGNOSIS — E782 Mixed hyperlipidemia: Secondary | ICD-10-CM

## 2018-01-30 DIAGNOSIS — Z79899 Other long term (current) drug therapy: Secondary | ICD-10-CM

## 2018-01-30 NOTE — Progress Notes (Signed)
Clinical Summary [name] is a 72 y.o.male seen today for follow up of the following medical problems.   1. CAD - prior inferior MI in 2008, received BMS to RCA - NSTEMI 07/2008 due to RCA ISR, treated with cutting balloon and additional DES. LVEF 60% by LV gram at that time.     - no recent chest pain.  - compliant with meds   2. HTN - compliant with meds  3. 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    Past Medical History:  Diagnosis Date  . 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 Arthritis   . Erectile dysfunction   . Gout    acute gouty arthropathy  . Hyperlipidemia   . Hypertension   . Tobacco abuse    60 pack years continuing at one pack per day      No Known Allergies   Current Outpatient Medications  Medication Sig Dispense Refill  . aspirin 81 MG tablet Take 81 mg by mouth daily.      Marland Kitchen atorvastatin (LIPITOR) 80 MG tablet Take 1 tablet (80 mg total) by mouth daily with breakfast. 90 tablet 3  . hydrochlorothiazide (HYDRODIURIL) 25 MG tablet TAKE 1 TABLET EVERY DAY 90 tablet 3  . ibuprofen (ADVIL,MOTRIN) 800 MG tablet Take 400 mg by mouth daily as needed for moderate pain.    Marland Kitchen lisinopril (PRINIVIL,ZESTRIL) 10 MG tablet TAKE 1 TABLET EVERY DAY 90 tablet 3  . sildenafil (VIAGRA) 50 MG tablet Take 1 tablet (50 mg total) by mouth daily as needed. 5 tablet 11  . varenicline (CHANTIX STARTING MONTH PAK) 0.5 MG X 11 & 1 MG X 42 tablet Take one 0.5 mg tablet by mouth once daily for 3 days, then increase to one 0.5 mg tablet twice daily for 4 days, then increase to one 1 mg tablet twice daily for 11  weeks 53 tablet 0   No current facility-administered medications for this visit.      Past Surgical History:  Procedure Laterality Date  . CARDIAC CATHETERIZATION     2 stents inserted  . CATARACT EXTRACTION W/PHACO Left 05/14/2016   Procedure: CATARACT EXTRACTION PHACO AND INTRAOCULAR LENS PLACEMENT (IOC);  Surgeon: Fabio Pierce, MD;  Location: [facility];  Service: Ophthalmology;  Laterality: Left;  CDE: 12.39  . CATARACT EXTRACTION W/PHACO Right 07/16/2016   Procedure: CATARACT EXTRACTION PHACO AND INTRAOCULAR LENS PLACEMENT (IOC);  Surgeon: Fabio Pierce, MD;  Location: [facility];  Service: Ophthalmology;  Laterality: Right;  CDE: 17.59  . CIRCUMCISION    . SKIN LESION EXCISION Left    left leg and back.     No Known Allergies    History reviewed. No pertinent family history.   Social History Mr. Near reports that he has been smoking cigarettes. He started smoking about 51 years ago. He has a 20.00 pack-year smoking history. He has never used smokeless tobacco. [name] reports that he drinks about 5.0 standard drinks of alcohol per week.   Review of Systems CONSTITUTIONAL: No weight loss, fever, chills, weakness or fatigue.  HEENT: Eyes: No visual loss, blurred vision, double vision or yellow sclerae.No  hearing loss, sneezing, congestion, runny nose or sore throat.  SKIN: No rash or itching.  CARDIOVASCULAR: per hpi RESPIRATORY: No shortness of breath, cough or sputum.  GASTROINTESTINAL: No anorexia, nausea, vomiting or diarrhea. No abdominal pain or blood.  GENITOURINARY: No burning on urination, no polyuria NEUROLOGICAL: No headache, dizziness, syncope, paralysis, ataxia, numbness or tingling in the extremities. No change in bowel or bladder control.  MUSCULOSKELETAL: No muscle, back pain, joint pain or stiffness.  LYMPHATICS: No enlarged nodes. No history of splenectomy.  PSYCHIATRIC: No history of depression or anxiety.  ENDOCRINOLOGIC: No reports of sweating, cold or  heat intolerance. No polyuria or polydipsia.  Marland Kitchen.   Physical Examination Vitals:   01/30/18 1151  BP: 122/60  Pulse: 92  SpO2: 96%   Filed Weights   01/30/18 1151  Weight: 143 lb (64.9 kg)    Gen: resting comfortably, no acute distress HEENT: no scleral icterus, pupils equal round and reactive, no palptable cervical adenopathy,  CV: RRR, no m/r/g, no jvd Resp: Clear to auscultation bilaterally GI: abdomen is soft, non-tender, non-distended, normal bowel sounds, no hepatosplenomegaly MSK: extremities are warm, no edema.  Skin: warm, no rash Neuro:  no focal deficits Psych: appropriate affect   Diagnostic Studies 07/2008 Cath RESULTS: Aortic pressure was 115/54 with mean of 85. Left index  pressure was 115/10.  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  Left anterior descending artery: The left anterior descending artery  gave rise to a large diagonal [name], septal perforator, and 2 small  diagonal branches. There was 40% narrowing in the proximal LAD after  the diagonal [name] and there was 50% narrowing in the very proximal and  ostium of the first diagonal [name]. There was moderate calcification  and no irregularities.  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 This was a small vessel that was diffusely  diseased with 70-80% stenoses in its proximal to midportion. It filled  2 small marginal branches. This vessel was also significantly diseased  on the prior study.  Right coronary artery: The right coronary artery was a moderately large  vessel that gave rise to a posterior descending [name] and a large  posterolateral [name]. There were multiple right angle turns in the  proximal vessel and there was a long stent in the midvessel which  traversed to a right angle bend. There was a 90% focal stenosis just  before the  second bend and the midvessel. There was also 70-80%  narrowing in the distal right coronary artery which extended just to the  posterior descending [name]. There was 60% narrowing in the  posterolateral Naveyah Iacovelli.  Left ventriculogram: The left ventriculogram performed in the RAO  projection showed hypokinesis of the inferobasal segment. The overall  wall motion was good with an estimated ejection fraction of 60%.  Following cutting balloon angioplasty, the lesion within the stent in  the mid-right coronary stenosis improved from 90% to 10%.  Following stenting of the lesion, the distal right coronary stenosis  improved from 80% to 0%..  CONCLUSION:  1. 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  2. Successful percutaneous coronary intervention of the in-stent  restenotic lesion in the mid-right coronary using a cutting balloon  angioplasty with improvement in central narrowing from 90% to 10%.  3. Successful percutaneous coronary intervention of the lesion in the  distal right coronary using a Xience drug-eluting stent with  improvement in central narrowing from 80% to 0%.  DISPOSITION: [facility]  observation. Recommend Plavix for at least a year.     10/19/13 Clinic EKG NSR  10/2013 AAA US IMPRESSION: No evidence of abdominal aortic aneurysm .      Assessment and Plan  1. CAD/ASCVD - no recent symptoms, continue current meds - EKG today shows SR, no acute ischemic changes  2. HTN -  at goal, continue current meds  3. Hyperlipidemia - repeat labs, continue statin.  F/u 1 year      Antoine PocheJonathan F. Brittney Caraway, M.D.

## 2018-01-30 NOTE — Patient Instructions (Signed)
Medication Instructions:  Your physician recommends that you continue on your current medications as directed. Please refer to the Current Medication list given to you today.   Labwork: ASAP   Testing/Procedures: NONE  Follow-Up: Your physician wants you to follow-up in: 1 YEAR  You will receive a reminder letter in the mail two months in advance. If you don't receive a letter, please call our office to schedule the follow-up appointment.   Any Other Special Instructions Will Be Listed Below (If Applicable).     If you need a refill on your cardiac medications before your next appointment, please call your pharmacy.   

## 2018-07-04 ENCOUNTER — Other Ambulatory Visit: Payer: Self-pay

## 2018-07-04 MED ORDER — ATORVASTATIN CALCIUM 80 MG PO TABS: 80.0000 mg | ORAL_TABLET | Freq: Every day | ORAL | 3 refills | Status: DC

## 2018-07-04 NOTE — Telephone Encounter (Signed)
Refilled atorvastatin to [facility]

## 2018-07-06 ENCOUNTER — Other Ambulatory Visit: Payer: Self-pay | Admitting: *Deleted

## 2018-07-06 MED ORDER — ATORVASTATIN CALCIUM 80 MG PO TABS: 80.0000 mg | ORAL_TABLET | Freq: Every day | ORAL | 3 refills | Status: DC

## 2018-09-21 ENCOUNTER — Other Ambulatory Visit: Payer: Self-pay | Admitting: Cardiology

## 2019-01-16 DIAGNOSIS — K121 Other forms of stomatitis: Secondary | ICD-10-CM | POA: Diagnosis not present

## 2019-01-16 DIAGNOSIS — Z1159 Encounter for screening for other viral diseases: Secondary | ICD-10-CM | POA: Diagnosis not present

## 2019-01-16 DIAGNOSIS — J309 Allergic rhinitis, unspecified: Secondary | ICD-10-CM | POA: Diagnosis not present

## 2019-02-12 DIAGNOSIS — Z961 Presence of intraocular lens: Secondary | ICD-10-CM | POA: Diagnosis not present

## 2019-02-12 DIAGNOSIS — H26493 Other secondary cataract, bilateral: Secondary | ICD-10-CM | POA: Diagnosis not present

## 2019-02-16 DIAGNOSIS — H26491 Other secondary cataract, right eye: Secondary | ICD-10-CM | POA: Diagnosis not present

## 2019-02-27 ENCOUNTER — Telehealth: Payer: Self-pay | Admitting: Cardiology

## 2019-02-27 NOTE — Telephone Encounter (Signed)
Virtual Visit Pre-Appointment Phone Call  "(Name), I am calling you today to discuss your upcoming appointment. We are currently trying to limit exposure to the virus that causes COVID-19 by seeing patients at home rather than in the office."  1. "What is the BEST phone number to call the day of the visit?" - include this in appointment notes  2. Do you have or have access to (through a family member/friend) a smartphone with video capability that we can use for your visit?" a. If yes - list this number in appt notes as cell (if different from BEST phone #) and list the appointment type as a VIDEO visit in appointment notes b. If no - list the appointment type as a PHONE visit in appointment notes  3. Confirm consent - "In the setting of the current Covid19 crisis, you are scheduled for a (phone or video) visit with your provider on (date) at (time).  Just as we do with many in-office visits, in order for you to participate in this visit, we must obtain consent.  If you'd like, I can send this to your mychart (if signed up) or email for you to review.  Otherwise, I can obtain your verbal consent now.  All virtual visits are billed to your insurance company just like a normal visit would be.  By agreeing to a virtual visit, we'd like you to understand that the technology does not allow for your provider to perform an examination, and thus may limit your provider's ability to fully assess your condition. If your provider identifies any concerns that need to be evaluated in person, we will make arrangements to do so.  Finally, though the technology is pretty good, we cannot assure that it will always work on either your or our end, and in the setting of a video visit, we may have to convert it to a phone-only visit.  In either situation, we cannot ensure that we have a secure connection.  Are you willing to proceed?" STAFF: Did the patient verbally acknowledge consent to telehealth visit? Document  YES/NO here: yes  4. Advise patient to be prepared - "Two hours prior to your appointment, go ahead and check your blood pressure, pulse, oxygen saturation, and your weight (if you have the equipment to check those) and write them all down. When your visit starts, your provider will ask you for this information. If you have an Apple Watch or Kardia device, please plan to have heart rate information ready on the day of your appointment. Please have a pen and paper handy nearby the day of the visit as well."  5. Give patient instructions for MyChart download to smartphone OR [email or web address] as below if video visit (depending on what platform provider is using)  6. Inform patient they will receive a phone call 15 minutes prior to their appointment time (may be from unknown caller ID) so they should be prepared to answer    TELEPHONE CALL NOTE  [name] has been deemed a candidate for a follow-up tele-health visit to limit community exposure during the Covid-19 pandemic. I spoke with the patient via phone to ensure availability of phone/video source, confirm preferred email & phone number, and discuss instructions and expectations.  I reminded [name] to be prepared with any vital sign and/or heart rhythm information that could potentially be obtained via home monitoring, at the time of his visit. I reminded [name] to expect a phone call prior to  his visit.  [name] 02/27/2019 2:14 PM

## 2019-02-28 ENCOUNTER — Encounter: Payer: Self-pay | Admitting: Cardiology

## 2019-02-28 ENCOUNTER — Telehealth (INDEPENDENT_AMBULATORY_CARE_PROVIDER_SITE_OTHER): Payer: Medicare HMO | Admitting: Cardiology

## 2019-02-28 ENCOUNTER — Other Ambulatory Visit: Payer: Self-pay

## 2019-02-28 VITALS — Ht 69.0 in | Wt 138.0 lb

## 2019-02-28 DIAGNOSIS — E782 Mixed hyperlipidemia: Secondary | ICD-10-CM

## 2019-02-28 DIAGNOSIS — I2510 Atherosclerotic heart disease of native coronary artery without angina pectoris: Secondary | ICD-10-CM | POA: Diagnosis not present

## 2019-02-28 DIAGNOSIS — I10 Essential (primary) hypertension: Secondary | ICD-10-CM

## 2019-02-28 NOTE — Patient Instructions (Signed)

## 2019-02-28 NOTE — Progress Notes (Signed)
Virtual Visit via Telephone Note   This visit type was conducted due to national recommendations for restrictions regarding the COVID-19 Pandemic (e.g. social distancing) in an effort to limit this patient's exposure and mitigate transmission in our community.  Due to his co-morbid illnesses, this patient is at least at moderate risk for complications without adequate follow up.  This format is felt to be most appropriate for this patient at this time.  The patient did not have access to video technology/had technical difficulties with video requiring transitioning to audio format only (telephone).  All issues noted in this document were discussed and addressed.  No physical exam could be performed with this format.  Please refer to the patient's chart for his  consent to [facility].   Date:  02/28/2019   ID:  [name], DOB [birth year], MRN [id number]  Patient Location: Home Provider Location: Home  PCP:  Pearson GrippeKim, James, MD  Cardiologist:  Dina RichBranch, Soham Hollett, MD  Electrophysiologist:  None   Evaluation Performed:  Follow-Up Visit  Chief Complaint:  Follow up visit  History of Present Illness:    [name] is a 73 y.o. male seen today for follow up of the following medical problems.   1. CAD - prior inferior MI in 2008, received BMS to RCA - NSTEMI 07/2008 due to RCA ISR, treated with cutting balloon and additional DES. LVEF 60% by LV gram at that time.    - no recent chest pain. No SOB/DOE - compliant with meds   2. HTN - compliant with meds  3. 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  The patient does not have symptoms concerning for COVID-19 infection (fever, chills, cough, or new shortness of breath).    Past Medical History:  Diagnosis Date  . 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 Arthritis   . Erectile dysfunction   . Gout    acute gouty arthropathy  . Hyperlipidemia   . Hypertension   . Tobacco abuse    60 pack years continuing at one pack per day    Past Surgical History:  Procedure Laterality Date  . CARDIAC CATHETERIZATION     2 stents inserted  . CATARACT EXTRACTION W/PHACO Left 05/14/2016   Procedure: CATARACT EXTRACTION PHACO AND INTRAOCULAR LENS PLACEMENT (IOC);  Surgeon: Fabio PierceJames Wrzosek, MD;  Location: [facility];  Service: Ophthalmology;  Laterality: Left;  CDE: 12.39  . CATARACT EXTRACTION W/PHACO Right 07/16/2016   Procedure: CATARACT EXTRACTION PHACO AND INTRAOCULAR LENS PLACEMENT (IOC);  Surgeon: Fabio PierceJames Wrzosek, MD;  Location: [facility];  Service: Ophthalmology;  Laterality: Right;  CDE: 17.59  . CIRCUMCISION    . SKIN LESION EXCISION Left    left leg and back.     No outpatient medications have been marked as taking for the 02/28/19 encounter (Appointment) with Antoine PocheBranch, Jazzmine Kleiman F, MD.     Allergies:   Patient has no known allergies.   Social History   Tobacco Use  . Smoking status: Current Every Day Smoker    Packs/day: 0.50    Years: 40.00  Pack years: 20.00    Types: Cigarettes    Start date: 11/12/1966  . Smokeless tobacco: Never Used  . Tobacco comment: Already on Chantix and in the process ofquitting  Substance Use Topics  . Alcohol use: Yes    Alcohol/week: 5.0 standard drinks    Types: 5 Cans of beer per week  . Drug use: No     Family Hx: The patient's family history is not on file.  ROS:   Please see the history of present illness.     All other systems reviewed and are negative.   Prior CV studies:   The following studies were reviewed today:  07/2008 Cath  RESULTS: Aortic pressure was 115/54 with mean of 85. Left index  pressure was 115/10.  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  Left anterior descending artery: The left anterior descending artery  gave rise to a large diagonal Derec Mozingo, septal perforator, and 2 small  diagonal branches. There was 40% narrowing in the proximal LAD after  the diagonal Jams Trickett and there was 50% narrowing in the very proximal and  ostium of the first diagonal Tipton Ballow. There was moderate calcification  and no irregularities.  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 This was a small vessel that was diffusely  diseased with 70-80% stenoses in its proximal to midportion. It filled  2 small marginal branches. This vessel was also significantly diseased  on the prior study.  Right coronary artery: The right coronary artery was a moderately large  vessel that gave rise to a posterior descending Laniqua Torrens and a large  posterolateral Alizandra Loh. There were multiple right angle turns in the  proximal vessel and there was a long stent in the midvessel which  traversed to a right angle bend. There was a 90% focal stenosis just  before the second bend and the midvessel. There was also 70-80%  narrowing in the distal right coronary artery which extended just to the  posterior descending Lilit Cinelli. There was 60% narrowing in the  posterolateral Quanita Barona.  Left ventriculogram: The left ventriculogram performed in the RAO  projection showed hypokinesis of the inferobasal segment. The overall  wall motion was good with an estimated ejection fraction of 60%.  Following cutting balloon angioplasty, the lesion within the stent in  the mid-right coronary stenosis improved from 90% to 10%.  Following stenting of the lesion, the distal right coronary stenosis  improved from 80% to 0%..   CONCLUSION:  1. 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  2. Successful percutaneous coronary intervention of the in-stent  restenotic lesion in the mid-right coronary using a cutting balloon  angioplasty with improvement in central narrowing from 90% to 10%.  3. Successful percutaneous coronary intervention of the lesion in the  distal right coronary using a Xience drug-eluting stent with  improvement in central narrowing from 80% to 0%.  DISPOSITION: The patient returned to [facility] room for further  observation. Recommend Plavix for at least a year.  10/19/13 Clinic EKG NSR  10/2013 AAA US IMPRESSION: No evidence of abdominal aortic aneurysm .   Labs/Other Tests and Data Reviewed:    EKG:  No ECG reviewed.  Recent Labs: No results found for requested labs within last 8760 hours.   Recent Lipid Panel Lab Results  Component Value Date/Time   CHOL 155 12/18/2014 08:53 AM   TRIG 259 (H) 12/18/2014 08:53 AM   TRIG 216 09/04/2008   HDL 44 12/18/2014 08:53 AM   CHOLHDL 3.5 12/18/2014 08:53 AM   LDLCALC 59 12/18/2014 08:53 AM    Wt Readings from Last 3 Encounters:  01/30/18 143 lb (64.9 kg)  12/08/16 142 lb (64.4 kg)  07/16/16 147 lb (66.7 kg)     Objective:    Vital Signs:   Today's Vitals   02/28/19 0944  Weight: 138 lb (62.6 kg)  Height: 5\' 9"  (1.753 m)   Body mass index is 20.38 kg/m.  Normal affect. Normal speech pattern and tone. Comfortable, no apparent distress. No audible signs of SOB or wheezing.    ASSESSMENT & PLAN:   1. CAD/ASCVD - denies any symptoms, conitnue curent meds  2. HTN - continue current meds  3. Hyperlipidemia - he will continue statin, we will obtain labs next visit, he is hesitant at this time due to covid risks.    COVID-19 Education: The signs and symptoms of COVID-19 were discussed with the patient and how to seek care for testing (follow up with PCP or arrange E-visit).  The importance of social distancing was discussed today.  Time:   Today, I have spent 15 minutes with the patient with telehealth technology discussing the above problems.     Medication Adjustments/Labs and Tests Ordered: Current medicines are reviewed at length with the patient today.  Concerns regarding medicines are outlined above.   Tests Ordered: No orders of the defined types were placed in this encounter.   Medication Changes: No orders of the defined types were placed in this encounter.   Follow Up:  In Person in 6 month(s)  Signed, Dina Rich, MD  02/28/2019 8:18 AM    [facility]

## 2019-08-08 DIAGNOSIS — J309 Allergic rhinitis, unspecified: Secondary | ICD-10-CM | POA: Diagnosis not present

## 2019-08-08 DIAGNOSIS — R0981 Nasal congestion: Secondary | ICD-10-CM | POA: Diagnosis not present

## 2019-10-23 ENCOUNTER — Encounter: Payer: Self-pay | Admitting: Cardiology

## 2019-10-23 ENCOUNTER — Ambulatory Visit: Payer: Medicare HMO | Admitting: Cardiology

## 2019-10-23 ENCOUNTER — Other Ambulatory Visit: Payer: Self-pay

## 2019-10-23 VITALS — BP 128/58 | HR 92 | Temp 97.3°F | Ht 69.0 in | Wt 147.0 lb

## 2019-10-23 DIAGNOSIS — I10 Essential (primary) hypertension: Secondary | ICD-10-CM

## 2019-10-23 DIAGNOSIS — Z79899 Other long term (current) drug therapy: Secondary | ICD-10-CM

## 2019-10-23 DIAGNOSIS — I2510 Atherosclerotic heart disease of native coronary artery without angina pectoris: Secondary | ICD-10-CM

## 2019-10-23 DIAGNOSIS — E785 Hyperlipidemia, unspecified: Secondary | ICD-10-CM

## 2019-10-23 NOTE — Patient Instructions (Signed)
Medication Instructions:  °Your physician recommends that you continue on your current medications as directed. Please refer to the Current Medication list given to you today. ° °*If you need a refill on your cardiac medications before your next appointment, please call your pharmacy* ° ° °Lab Work: °Your physician recommends that you return for lab work in: Fasting  ° °If you have labs (blood work) drawn today and your tests are completely normal, you will receive your results only by: °• MyChart Message (if you have MyChart) OR °• A paper copy in the mail °If you have any lab test that is abnormal or we need to change your treatment, we will call you to review the results. ° ° °Testing/Procedures: °NONE  ° ° °Follow-Up: °At [facility], you and your health needs are our priority.  As part of our continuing mission to provide you with exceptional heart care, we have created designated Provider Care Teams.  These Care Teams include your primary Cardiologist (physician) and Advanced Practice Providers (APPs -  Physician Assistants and Nurse Practitioners) who all work together to provide you with the care you need, when you need it. ° °We recommend signing up for the patient portal called "MyChart".  Sign up information is provided on this After Visit Summary.  MyChart is used to connect with patients for Virtual Visits (Telemedicine).  Patients are able to view lab/test results, encounter notes, upcoming appointments, etc.  Non-urgent messages can be sent to your provider as well.   °To learn more about what you can do with MyChart, go to [email or web address].   ° °Your next appointment:   °6 month(s) ° °The format for your next appointment:   °In Person ° °Provider:   °Jonathan Branch, MD ° ° °Other Instructions °Thank you for choosing [facility]! ° ° ° °

## 2019-10-23 NOTE — Progress Notes (Signed)
Clinical Summary [name] is a 74 y.o.male seen today for follow up of the following medical problems.   1. CAD - prior inferior MI in 2008, received BMS to RCA - NSTEMI 07/2008 due to RCA ISR, treated with cutting balloon and additional DES. LVEF 60% by LV gram at that time.   - no recent chest pain. No SOB/DOE - compliant with meds   2. HTN -he is compliant with meds.   3. Hyperlipidemia -remains compliant with statin     SH: works [employer], [employer] for 42 years. Recently retired.   Past Medical History:  Diagnosis Date  . 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 Arthritis   . Erectile dysfunction   . Gout    acute gouty arthropathy  . Hyperlipidemia   . Hypertension   . Tobacco abuse    60 pack years continuing at one pack per day      No Known Allergies   Current Outpatient Medications  Medication Sig Dispense Refill  . aspirin 81 MG tablet Take 81 mg by mouth daily.      Marland Kitchen atorvastatin (LIPITOR) 80 MG tablet Take 1 tablet (80 mg total) by mouth daily with breakfast. 90 tablet 3  . fluticasone (FLONASE) 50 MCG/ACT nasal spray     . hydrochlorothiazide (HYDRODIURIL) 25 MG tablet TAKE 1 TABLET EVERY DAY 90 tablet 3  . lisinopril (PRINIVIL,ZESTRIL) 10 MG tablet TAKE 1 TABLET EVERY DAY 90 tablet 3   No current facility-administered medications for this visit.     Past Surgical History:  Procedure Laterality Date  . CARDIAC CATHETERIZATION     2 stents inserted  . CATARACT EXTRACTION W/PHACO Left 05/14/2016   Procedure: CATARACT EXTRACTION PHACO AND INTRAOCULAR LENS PLACEMENT (IOC);  Surgeon: Fabio Pierce, MD;  Location: [facility];  Service: Ophthalmology;  Laterality:  Left;  CDE: 12.39  . CATARACT EXTRACTION W/PHACO Right 07/16/2016   Procedure: CATARACT EXTRACTION PHACO AND INTRAOCULAR LENS PLACEMENT (IOC);  Surgeon: Fabio Pierce, MD;  Location: [facility];  Service: Ophthalmology;  Laterality: Right;  CDE: 17.59  . CIRCUMCISION    . SKIN LESION EXCISION Left    left leg and back.     No Known Allergies    History reviewed. No pertinent family history.   Social History Mr. Delapena reports that he has been smoking cigarettes. He started smoking about 52 years ago. He has a 20.00 pack-year smoking history. He has never used smokeless tobacco. Mr. Antosh reports current alcohol use of about 5.0 standard drinks of alcohol per week.   Review of Systems CONSTITUTIONAL: No weight loss, fever, chills, weakness or fatigue.  HEENT: Eyes: No visual loss, blurred vision, double vision or yellow sclerae.No hearing loss, sneezing, congestion, runny nose or sore throat.  SKIN: No rash or itching.  CARDIOVASCULAR: per hpi RESPIRATORY: No shortness of breath, cough or sputum.  GASTROINTESTINAL: No anorexia, nausea, vomiting or diarrhea. No abdominal pain or blood.  GENITOURINARY: No burning on urination, no polyuria NEUROLOGICAL: No headache, dizziness, syncope, paralysis, ataxia, numbness or tingling in the extremities. No change in bowel or bladder control.  MUSCULOSKELETAL: No muscle, back pain, joint pain or stiffness.  LYMPHATICS: No enlarged nodes. No history of splenectomy.  PSYCHIATRIC: No  history of depression or anxiety.  ENDOCRINOLOGIC: No reports of sweating, cold or heat intolerance. No polyuria or polydipsia.  Marland Kitchen   Physical Examination Vitals:   10/23/19 1134  BP: (!) 128/58  Pulse: 92  Temp: (!) 97.3 F (36.3 C)  SpO2: 97%   Filed Weights   10/23/19 1134  Weight: 147 lb (66.7 kg)    Gen: resting comfortably, no acute distress HEENT: no scleral icterus, pupils equal round and reactive, no palptable cervical adenopathy,  CV: RRR, no  m/r/g, no jvd Resp: Clear to auscultation bilaterally GI: abdomen is soft, non-tender, non-distended, normal bowel sounds, no hepatosplenomegaly MSK: extremities are warm, no edema.  Skin: warm, no rash Neuro:  no focal deficits Psych: appropriate affect   Diagnostic Studies 07/2008 Cath RESULTS: Aortic pressure was 115/54 with mean of 85. Left index  pressure was 115/10.  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  Left anterior descending artery: The left anterior descending artery  gave rise to a large diagonal Signa Cheek, septal perforator, and 2 small  diagonal branches. There was 40% narrowing in the proximal LAD after  the diagonal Kathan Kirker and there was 50% narrowing in the very proximal and  ostium of the first diagonal Khyler Urda. There was moderate calcification  and no irregularities.  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 This was a small vessel that was diffusely  diseased with 70-80% stenoses in its proximal to midportion. It filled  2 small marginal branches. This vessel was also significantly diseased  on the prior study.  Right coronary artery: The right coronary artery was a moderately large  vessel that gave rise to a posterior descending Crosby Bevan and a large  posterolateral Lasheena Frieze. There were multiple right angle turns in the  proximal vessel and there was a long stent in the midvessel which  traversed to a right angle bend. There was a 90% focal stenosis just  before the second bend and the midvessel. There was also 70-80%  narrowing in the distal right coronary artery which extended just to the  posterior descending Dinh Ayotte. There was 60% narrowing in the  posterolateral Tyson Parkison.  Left ventriculogram: The left ventriculogram performed in the RAO  projection showed hypokinesis of the inferobasal segment. The overall   wall motion was good with an estimated ejection fraction of 60%.  Following cutting balloon angioplasty, the lesion within the stent in  the mid-right coronary stenosis improved from 90% to 10%.  Following stenting of the lesion, the distal right coronary stenosis  improved from 80% to 0%..  CONCLUSION:  1. 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  2. Successful percutaneous coronary intervention of the in-stent  restenotic lesion in the mid-right coronary using a cutting balloon  angioplasty with improvement in central narrowing from 90% to 10%.  3. Successful percutaneous coronary intervention of the lesion in the  distal right coronary using a Xience drug-eluting stent with  improvement in central narrowing from 80% to 0%.  DISPOSITION: The patient returned  to the post-angio room for further  observation. Recommend Plavix for at least a year.     10/19/13 Clinic EKG NSR  10/2013 AAA US IMPRESSION: No evidence of abdominal aortic aneurysm .    Assessment and Plan  1. CAD/ASCVD -no recent symptoms, continue current meds  2. HTN - he is at goal, continue current meds  3. Hyperlipidemia -repeat labs, continue statin  Obtain annual labs. F/u 6 months     Antoine Poche, M.D

## 2019-10-29 ENCOUNTER — Other Ambulatory Visit: Payer: Self-pay | Admitting: Cardiology

## 2019-11-01 ENCOUNTER — Other Ambulatory Visit: Payer: Self-pay | Admitting: *Deleted

## 2019-11-01 MED ORDER — ATORVASTATIN CALCIUM 80 MG PO TABS: ORAL_TABLET | ORAL | 3 refills | Status: DC

## 2020-04-28 ENCOUNTER — Ambulatory Visit: Payer: Medicare HMO | Admitting: Cardiology

## 2020-04-28 NOTE — Progress Notes (Deleted)
Clinical Summary Mr. Koepp is a 74 y.o.male seen today for follow up of the following medical problems.   1. CAD - prior inferior MI in 2008, received BMS to RCA - NSTEMI 07/2008 due to RCA ISR, treated with cutting balloon and additional DES. LVEF 60% by LV gram at that time.   - no recent chest pain. No SOB/DOE - compliant with meds   2. HTN -he is compliant with meds.   3. Hyperlipidemia -remains compliant with statin     SH: works [employer], [employer] for 42 years. Recently retired.   Past Medical History:  Diagnosis Date  . 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 Arthritis   . Erectile dysfunction   . Gout    acute gouty arthropathy  . Hyperlipidemia   . Hypertension   . Tobacco abuse    60 pack years continuing at one pack per day      No Known Allergies   Current Outpatient Medications  Medication Sig Dispense Refill  . aspirin 81 MG tablet Take 81 mg by mouth daily.      Marland Kitchen atorvastatin (LIPITOR) 80 MG tablet TAKE 1 TABLET EVERY DAY WITH BREAKFAST 90 tablet 3  . fluticasone (FLONASE) 50 MCG/ACT nasal spray     . hydrochlorothiazide (HYDRODIURIL) 25 MG tablet TAKE 1 TABLET EVERY DAY 90 tablet 3  . lisinopril (ZESTRIL) 10 MG tablet TAKE 1 TABLET EVERY DAY 90 tablet 3   No current facility-administered medications for this visit.     Past Surgical History:  Procedure Laterality Date  . CARDIAC CATHETERIZATION     2 stents inserted  . CATARACT EXTRACTION W/PHACO Left 05/14/2016   Procedure: CATARACT EXTRACTION PHACO AND INTRAOCULAR LENS PLACEMENT (IOC);  Surgeon: Fabio Pierce, MD;  Location: [facility];  Service: Ophthalmology;  Laterality: Left;  CDE: 12.39  .  CATARACT EXTRACTION W/PHACO Right 07/16/2016   Procedure: CATARACT EXTRACTION PHACO AND INTRAOCULAR LENS PLACEMENT (IOC);  Surgeon: Fabio Pierce, MD;  Location: [facility];  Service: Ophthalmology;  Laterality: Right;  CDE: 17.59  . CIRCUMCISION    . SKIN LESION EXCISION Left    left leg and back.     No Known Allergies    No family history on file.   Social History Mr. Kundrat reports that he has been smoking cigarettes. He started smoking about 53 years ago. He has a 20.00 pack-year smoking history. He has never used smokeless tobacco. Mr. Simkins reports current alcohol use of about 5.0 standard drinks of alcohol per week.   Review of Systems CONSTITUTIONAL: No weight loss, fever, chills, weakness or fatigue.  HEENT: Eyes: No visual loss, blurred vision, double vision or yellow sclerae.No hearing loss, sneezing, congestion, runny nose or sore throat.  SKIN: No rash or itching.  CARDIOVASCULAR:  RESPIRATORY: No shortness of breath, cough or sputum.  GASTROINTESTINAL: No anorexia, nausea, vomiting or diarrhea. No abdominal pain or blood.  GENITOURINARY: No burning on urination, no polyuria NEUROLOGICAL: No headache, dizziness, syncope, paralysis, ataxia, numbness or tingling in the extremities. No change in bowel or bladder control.  MUSCULOSKELETAL: No muscle, back pain, joint pain or stiffness.  LYMPHATICS: No enlarged nodes. No history of splenectomy.  PSYCHIATRIC: No history of depression or anxiety.  ENDOCRINOLOGIC: No reports of sweating, cold or heat intolerance. No polyuria or polydipsia.  Marland Kitchen   Physical Examination There were no vitals filed for this visit. There were no vitals filed for this visit.  Gen: resting comfortably, no acute distress HEENT: no scleral icterus, pupils equal round and reactive, no palptable cervical adenopathy,  CV Resp: Clear to auscultation bilaterally GI: abdomen is soft, non-tender, non-distended, normal bowel sounds, no  hepatosplenomegaly MSK: extremities are warm, no edema.  Skin: warm, no rash Neuro:  no focal deficits Psych: appropriate affect   Diagnostic Studies  07/2008 Cath RESULTS: Aortic pressure was 115/54 with mean of 85. Left index  pressure was 115/10.  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  Left anterior descending artery: The left anterior descending artery  gave rise to a large diagonal Angalena Cousineau, septal perforator, and 2 small  diagonal branches. There was 40% narrowing in the proximal LAD after  the diagonal Elira Colasanti and there was 50% narrowing in the very proximal and  ostium of the first diagonal Jashan Cotten. There was moderate calcification  and no irregularities.  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 This was a small vessel that was diffusely  diseased with 70-80% stenoses in its proximal to midportion. It filled  2 small marginal branches. This vessel was also significantly diseased  on the prior study.  Right coronary artery: The right coronary artery was a moderately large  vessel that gave rise to a posterior descending Versa Craton and a large  posterolateral Cisco Kindt. There were multiple right angle turns in the  proximal vessel and there was a long stent in the midvessel which  traversed to a right angle bend. There was a 90% focal stenosis just  before the second bend and the midvessel. There was also 70-80%  narrowing in the distal right coronary artery which extended just to the  posterior descending Caily Rakers. There was 60% narrowing in the  posterolateral Adama Ivins.  Left ventriculogram: The left ventriculogram performed in the RAO  projection showed hypokinesis of the inferobasal segment. The overall  wall motion was good with an estimated ejection fraction of 60%.  Following cutting balloon angioplasty, the lesion within  the stent in  the mid-right coronary stenosis improved from 90% to 10%.  Following stenting of the lesion, the distal right coronary stenosis  improved from 80% to 0%..  CONCLUSION:  1. 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  2. Successful percutaneous coronary intervention of the in-stent  restenotic lesion in the mid-right coronary using a cutting balloon  angioplasty with improvement in central narrowing from 90% to 10%.  3. Successful percutaneous coronary intervention of the lesion in the  distal right coronary using a Xience drug-eluting stent with  improvement in central narrowing from 80% to 0%.  DISPOSITION: The patient returned to the [facility] room for further  observation. Recommend Plavix for at least a year.     10/19/13 Clinic EKG NSR  10/2013 AAA US IMPRESSION: No evidence of abdominal  aortic aneurysm .   Assessment and Plan  1. CAD/ASCVD -no recent symptoms, continue current meds  2. HTN -he is at goal, continue current meds  3. Hyperlipidemia -repeat labs, continue statin      Antoine Poche, M.D.

## 2020-07-04 ENCOUNTER — Other Ambulatory Visit: Payer: Self-pay

## 2020-07-04 ENCOUNTER — Ambulatory Visit: Payer: Medicare HMO | Admitting: Cardiology

## 2020-07-04 ENCOUNTER — Encounter: Payer: Self-pay | Admitting: Cardiology

## 2020-07-04 VITALS — BP 130/64 | HR 90 | Ht 69.0 in | Wt 143.6 lb

## 2020-07-04 DIAGNOSIS — I2510 Atherosclerotic heart disease of native coronary artery without angina pectoris: Secondary | ICD-10-CM

## 2020-07-04 DIAGNOSIS — E785 Hyperlipidemia, unspecified: Secondary | ICD-10-CM

## 2020-07-04 DIAGNOSIS — I10 Essential (primary) hypertension: Secondary | ICD-10-CM | POA: Diagnosis not present

## 2020-07-04 NOTE — Patient Instructions (Signed)
Your physician wants you to follow-up in: 1 YEAR WITH DR [facility] You will receive a reminder letter in the mail two months in advance. If you don't receive a letter, please call our office to schedule the follow-up appointment.  Your physician recommends that you continue on your current medications as directed. Please refer to the Current Medication list given to you today.  Your physician recommends that you return for lab work - PLEASE FAST 6-8 HOURS PRIOR TO LAB WORK CBC/BMP/TSH/LIPIDS/HGBA1C/MG  Thank you for choosing [facility]!!

## 2020-07-04 NOTE — Progress Notes (Signed)
Clinical Summary Mr. Sollenberger is a 75 y.o.male  seen today for follow up of the following medical problems.   1. CAD - prior inferior MI in 2008, received BMS to RCA - NSTEMI 07/2008 due to RCA ISR, treated with cutting balloon and additional DES. LVEF 60% by LV gram at that time.  - no recent chest pain. No SOB/DOE - compliant with meds   2. HTN -compliant with meds  3. Hyperlipidemia -rcompliant with statin - has not gone for labs that were ordered at last visit   SH: works [employer], [employer] for 42 years. Recently retired.   SH: has covid vaccine x 3.   Past Medical History:  Diagnosis Date  . 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 Arthritis   . Erectile dysfunction   . Gout    acute gouty arthropathy  . Hyperlipidemia   . Hypertension   . Tobacco abuse    60 pack years continuing at one pack per day      No Known Allergies   Current Outpatient Medications  Medication Sig Dispense Refill  . aspirin 81 MG tablet Take 81 mg by mouth daily.      Marland Kitchen atorvastatin (LIPITOR) 80 MG tablet TAKE 1 TABLET EVERY DAY WITH BREAKFAST 90 tablet 3  . fluticasone (FLONASE) 50 MCG/ACT nasal spray     . hydrochlorothiazide (HYDRODIURIL) 25 MG tablet TAKE 1 TABLET EVERY DAY 90 tablet 3  . lisinopril (ZESTRIL) 10 MG tablet TAKE 1 TABLET EVERY DAY 90 tablet 3   No current facility-administered medications for this visit.     Past Surgical History:  Procedure Laterality Date  . CARDIAC CATHETERIZATION     2 stents inserted  . CATARACT EXTRACTION W/PHACO Left 05/14/2016   Procedure: CATARACT EXTRACTION PHACO AND INTRAOCULAR LENS PLACEMENT (IOC);  Surgeon: Fabio Pierce, MD;  Location: [facility];   Service: Ophthalmology;  Laterality: Left;  CDE: 12.39  . CATARACT EXTRACTION W/PHACO Right 07/16/2016   Procedure: CATARACT EXTRACTION PHACO AND INTRAOCULAR LENS PLACEMENT (IOC);  Surgeon: Fabio Pierce, MD;  Location: [facility];  Service: Ophthalmology;  Laterality: Right;  CDE: 17.59  . CIRCUMCISION    . SKIN LESION EXCISION Left    left leg and back.     No Known Allergies    No family history on file.   Social History Mr. Pelc reports that he has been smoking cigarettes. He started smoking about 53 years ago. He has a 20.00 pack-year smoking history. He has never used smokeless tobacco. Mr. Wadsworth reports current alcohol use of about 5.0 standard drinks of alcohol per week.   Review of Systems CONSTITUTIONAL: No weight loss, fever, chills, weakness or fatigue.  HEENT: Eyes: No visual loss, blurred vision, double vision or yellow sclerae.No hearing loss, sneezing, congestion, runny nose or sore throat.  SKIN: No rash or itching.  CARDIOVASCULAR: per hpi RESPIRATORY: No shortness of breath, cough or sputum.  GASTROINTESTINAL: No anorexia, nausea, vomiting or diarrhea. No abdominal pain or blood.  GENITOURINARY: No burning on urination, no polyuria NEUROLOGICAL: No headache, dizziness, syncope, paralysis, ataxia, numbness or tingling in the extremities. No change in bowel or bladder control.  MUSCULOSKELETAL: No muscle, back pain, joint pain or stiffness.  LYMPHATICS: No  enlarged nodes. No history of splenectomy.  PSYCHIATRIC: No history of depression or anxiety.  ENDOCRINOLOGIC: No reports of sweating, cold or heat intolerance. No polyuria or polydipsia.  Marland Kitchen   Physical Examination Today's Vitals   07/04/20 1013  BP: 130/64  Pulse: 90  SpO2: 98%  Weight: 143 lb 9.6 oz (65.1 kg)  Height: 5\' 9"  (1.753 m)   Body mass index is 21.21 kg/m.  Gen: resting comfortably, no acute distress HEENT: no scleral icterus, pupils equal round and reactive, no palptable cervical  adenopathy,  CV: RRR, no m/r,g no jvd Resp: Clear to auscultation bilaterally GI: abdomen is soft, non-tender, non-distended, normal bowel sounds, no hepatosplenomegaly MSK: extremities are warm, no edema.  Skin: warm, no rash Neuro:  no focal deficits Psych: appropriate affect   Diagnostic Studies  07/2008 Cath RESULTS: Aortic pressure was 115/54 with mean of 85. Left index  pressure was 115/10.  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  Left anterior descending artery: The left anterior descending artery  gave rise to a large diagonal Brantley Wiley, septal perforator, and 2 small  diagonal branches. There was 40% narrowing in the proximal LAD after  the diagonal Celsa Nordahl and there was 50% narrowing in the very proximal and  ostium of the first diagonal Sofia Vanmeter. There was moderate calcification  and no irregularities.  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 This was a small vessel that was diffusely  diseased with 70-80% stenoses in its proximal to midportion. It filled  2 small marginal branches. This vessel was also significantly diseased  on the prior study.  Right coronary artery: The right coronary artery was a moderately large  vessel that gave rise to a posterior descending Orla Estrin and a large  posterolateral Ronie Fleeger. There were multiple right angle turns in the  proximal vessel and there was a long stent in the midvessel which  traversed to a right angle bend. There was a 90% focal stenosis just  before the second bend and the midvessel. There was also 70-80%  narrowing in the distal right coronary artery which extended just to the  posterior descending Marquis Diles. There was 60% narrowing in the  posterolateral Karlie Aung.  Left ventriculogram: The left ventriculogram performed in the RAO  projection showed hypokinesis of the  inferobasal segment. The overall  wall motion was good with an estimated ejection fraction of 60%.  Following cutting balloon angioplasty, the lesion within the stent in  the mid-right coronary stenosis improved from 90% to 10%.  Following stenting of the lesion, the distal right coronary stenosis  improved from 80% to 0%..  CONCLUSION:  1. 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  2. Successful percutaneous coronary intervention of the in-stent  restenotic lesion in the mid-right coronary using a cutting balloon  angioplasty with improvement in central narrowing from 90% to 10%.  3. Successful percutaneous coronary intervention of the lesion in the  distal right coronary using a Xience drug-eluting stent with  improvement in central narrowing  from 80% to 0%.  DISPOSITION: The patient returned to the [facility] room for further  observation. Recommend Plavix for at least a year.     10/19/13 Clinic EKG NSR  10/2013 AAA US IMPRESSION: No evidence of abdominal aortic aneurysm .   Assessment and Plan  1. CAD/ASCVD - no symptoms, continue current meds  2. HTN - bp at goal, continue current meds  3. Hyperlipidemia -order labs, continue statin  F/u 1 year, order annual labs. Encouraged him to have his blood work donw.       Antoine Poche, M.D.

## 2020-10-23 ENCOUNTER — Other Ambulatory Visit: Payer: Self-pay | Admitting: Cardiology

## 2021-02-18 ENCOUNTER — Encounter: Payer: Self-pay | Admitting: Nurse Practitioner

## 2021-02-18 ENCOUNTER — Encounter (INDEPENDENT_AMBULATORY_CARE_PROVIDER_SITE_OTHER): Payer: Self-pay

## 2021-02-18 ENCOUNTER — Other Ambulatory Visit: Payer: Self-pay

## 2021-02-18 ENCOUNTER — Ambulatory Visit (INDEPENDENT_AMBULATORY_CARE_PROVIDER_SITE_OTHER): Payer: Medicare HMO | Admitting: Nurse Practitioner

## 2021-02-18 VITALS — BP 131/71 | HR 94 | Temp 98.8°F | Ht 69.0 in | Wt 142.0 lb

## 2021-02-18 DIAGNOSIS — Z139 Encounter for screening, unspecified: Secondary | ICD-10-CM

## 2021-02-18 DIAGNOSIS — I2510 Atherosclerotic heart disease of native coronary artery without angina pectoris: Secondary | ICD-10-CM

## 2021-02-18 DIAGNOSIS — I10 Essential (primary) hypertension: Secondary | ICD-10-CM

## 2021-02-18 DIAGNOSIS — E785 Hyperlipidemia, unspecified: Secondary | ICD-10-CM | POA: Diagnosis not present

## 2021-02-18 DIAGNOSIS — Z7689 Persons encountering health services in other specified circumstances: Secondary | ICD-10-CM | POA: Insufficient documentation

## 2021-02-18 DIAGNOSIS — Z9109 Other allergy status, other than to drugs and biological substances: Secondary | ICD-10-CM | POA: Insufficient documentation

## 2021-02-18 MED ORDER — CETIRIZINE HCL 10 MG PO TABS
10.0000 mg | ORAL_TABLET | Freq: Every day | ORAL | 11 refills | Status: DC
Start: 2021-02-18 — End: 2021-05-28

## 2021-02-18 NOTE — Assessment & Plan Note (Signed)
-  he stopped flonase a while ago -still has some nasal congestion -Rx. zyrtec

## 2021-02-18 NOTE — Progress Notes (Signed)
New Patient Office Visit  Subjective:  Patient ID: [name], male    DOB: 04-05-1946  Age: 75 y.o. MRN: [id number]  CC:  Chief Complaint  Patient presents with   New Patient (Initial Visit)    Here to establish care. Complains of intermittent arthritis in his lower back, does not hurt today.    HPI [name] presents for new patient visit. Transferring care from [facility]. Last physical was "a while ago". Last labs were drawn over 3 months ago.  He is followed by Dr. Harl Bowie for CAD, HTN, and HLD.  He states in the AM he has some nasal congestion, and he would like to try something different for his allergies.  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    Arthritis    Erectile dysfunction    Gout    acute gouty arthropathy   Hyperlipidemia    Hypertension    Tobacco abuse    60 pack years continuing at one pack per day     Past Surgical History:  Procedure Laterality Date   CARDIAC CATHETERIZATION     2 stents inserted   CATARACT EXTRACTION W/PHACO Left 05/14/2016   Procedure: CATARACT EXTRACTION PHACO AND INTRAOCULAR LENS PLACEMENT ([place]);  Surgeon: Baruch Goldmann, MD;  Location: [facility];  Service: Ophthalmology;  Laterality: Left;  CDE: 12.39   CATARACT EXTRACTION W/PHACO Right 07/16/2016   Procedure: CATARACT EXTRACTION PHACO AND INTRAOCULAR LENS PLACEMENT ([place]);  Surgeon: Baruch Goldmann, MD;  Location: [facility];  Service: Ophthalmology;  Laterality: Right;  CDE: 17.59   CIRCUMCISION     SKIN LESION EXCISION Left    left leg and back.    History reviewed. No pertinent family history.  Social History   Socioeconomic History   Marital status: Widowed     Spouse name: Not on file   Number of children: 2   Years of education: Not on file   Highest education level: Not on file  Occupational History   Occupation: Retired (5 years as of 02/18/21)-    Comment: worked at [employer] in [place]  Tobacco Use   Smoking status: Every Day    Packs/day: 0.50    Years: 40.00    Pack years: 20.00    Types: Cigarettes    Start date: 11/12/1966   Smokeless tobacco: Never  Vaping Use   Vaping Use: Never used  Substance and Sexual Activity   Alcohol use: Yes    Alcohol/week: 5.0 standard drinks    Types: 5 Cans of beer per week   Drug use: No   Sexual activity: Not Currently    Birth control/protection: None  Other Topics Concern   Not on file  Social History Narrative   Not on file   Social Determinants of Health   Financial Resource Strain: Not on file  Food Insecurity: Not on file  Transportation Needs: Not on file  Physical Activity: Not on file  Stress: Not on file  Social Connections: Not on file  Intimate Partner Violence: Not on file    ROS Review of Systems  Objective:   Today's Vitals: BP 131/71 (BP Location: Left Arm, Patient Position: Sitting, Cuff Size: Large)  Pulse 94   Temp 98.8 F (37.1 C) (Oral)   Ht _0  (1.753 m)   Wt 142 lb (64.4 kg)   SpO2 96%   BMI 20.97 kg/m   Physical Exam  Assessment & Plan:   Problem List Items Addressed This Visit       Cardiovascular and Mediastinum   Arteriosclerotic cardiovascular disease (ASCVD)    -takes daily ASA -followed by Dr. Harl Bowie      Hypertension    BP Readings from Last 3 Encounters:  02/18/21 131/71  07/04/20 130/64  10/23/19 (!) 128/58  -well controlled today with lisinipril and HCTZ      Relevant Orders   CMP14+EGFR   Lipid Panel With LDL/HDL Ratio   CBC with Differential/Platelet     Other   Hyperlipidemia    -will check lipids with next set of labs      Relevant Orders   Lipid Panel With LDL/HDL Ratio   Establishing care with new  doctor, encounter for - Primary    -obtain records      Relevant Orders   CMP14+EGFR   Hepatitis C antibody   Lipid Panel With LDL/HDL Ratio   CBC with Differential/Platelet   Environmental allergies    -he stopped flonase a while ago -still has some nasal congestion -Rx. zyrtec       Relevant Medications   cetirizine (ZYRTEC) 10 MG tablet   Other Visit Diagnoses     Screening due       Relevant Orders   Hepatitis C antibody       Outpatient Encounter Medications as of 02/18/2021  Medication Sig   aspirin 81 MG tablet Take 81 mg by mouth daily.   atorvastatin (LIPITOR) 80 MG tablet TAKE 1 TABLET EVERY DAY WITH BREAKFAST   cetirizine (ZYRTEC) 10 MG tablet Take 1 tablet (10 mg total) by mouth daily.   hydrochlorothiazide (HYDRODIURIL) 25 MG tablet TAKE 1 TABLET EVERY DAY   lisinopril (ZESTRIL) 10 MG tablet TAKE 1 TABLET EVERY DAY   [DISCONTINUED] fluticasone (FLONASE) 50 MCG/ACT nasal spray    No facility-administered encounter medications on file as of 02/18/2021.    Follow-up: Return in about 6 weeks (around 04/01/2021) for Lab follow-up (HTN, HLD).   Noreene Larsson, NP

## 2021-02-18 NOTE — Assessment & Plan Note (Signed)
-  obtain records 

## 2021-02-18 NOTE — Patient Instructions (Signed)
Please have fasting labs drawn 2-3 days prior to your appointment so we can discuss the results during your office visit.  

## 2021-02-18 NOTE — Assessment & Plan Note (Signed)
-  will check lipids with next set of labs 

## 2021-02-18 NOTE — Assessment & Plan Note (Signed)
-  takes daily ASA -followed by Dr. Wyline Mood

## 2021-02-18 NOTE — Assessment & Plan Note (Signed)
-  no current maintenance therapy

## 2021-02-18 NOTE — Assessment & Plan Note (Signed)
BP Readings from Last 3 Encounters:  02/18/21 131/71  07/04/20 130/64  10/23/19 (!) 128/58   -well controlled today with lisinipril and HCTZ

## 2021-03-20 ENCOUNTER — Ambulatory Visit (INDEPENDENT_AMBULATORY_CARE_PROVIDER_SITE_OTHER): Payer: Medicare HMO | Admitting: Nurse Practitioner

## 2021-03-20 ENCOUNTER — Encounter (INDEPENDENT_AMBULATORY_CARE_PROVIDER_SITE_OTHER): Payer: Self-pay

## 2021-03-20 ENCOUNTER — Encounter: Payer: Self-pay | Admitting: Nurse Practitioner

## 2021-03-20 ENCOUNTER — Other Ambulatory Visit: Payer: Self-pay

## 2021-03-20 VITALS — BP 145/79 | HR 82 | Temp 98.5°F | Ht 69.0 in | Wt 143.0 lb

## 2021-03-20 DIAGNOSIS — R5382 Chronic fatigue, unspecified: Secondary | ICD-10-CM | POA: Diagnosis not present

## 2021-03-20 DIAGNOSIS — Z23 Encounter for immunization: Secondary | ICD-10-CM | POA: Diagnosis not present

## 2021-03-20 DIAGNOSIS — E785 Hyperlipidemia, unspecified: Secondary | ICD-10-CM

## 2021-03-20 DIAGNOSIS — I10 Essential (primary) hypertension: Secondary | ICD-10-CM

## 2021-03-20 NOTE — Progress Notes (Signed)
Acute Office Visit  Subjective:    Patient ID: [name], male    DOB: Nov 07, 1945, 75 y.o.   MRN: [id number]  Chief Complaint  Patient presents with   Follow-up    6 wk follow up     HPI Patient is in today for lab follow-up for HTN and HLD. He didn't have labs drawn prior to this appointment. NO adverse med effects reported.  He states that he feels like he has little energy, and this has been ongoing for several months.  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    Arthritis    Erectile dysfunction    Gout    acute gouty arthropathy   Hyperlipidemia    Hypertension    Tobacco abuse    60 pack years continuing at one pack per day     Past Surgical History:  Procedure Laterality Date   CARDIAC CATHETERIZATION     2 stents inserted   CATARACT EXTRACTION W/PHACO Left 05/14/2016   Procedure: CATARACT EXTRACTION PHACO AND INTRAOCULAR LENS PLACEMENT ([place]);  Surgeon: Fabio Pierce, MD;  Location: [facility];  Service: Ophthalmology;  Laterality: Left;  CDE: 12.39   CATARACT EXTRACTION W/PHACO Right 07/16/2016   Procedure: CATARACT EXTRACTION PHACO AND INTRAOCULAR LENS PLACEMENT ([facility]);  Surgeon: Fabio Pierce, MD;  Location: [facility];  Service: Ophthalmology;  Laterality: Right;  CDE: 17.59   CIRCUMCISION     SKIN LESION EXCISION Left    left leg and back.    History reviewed. No pertinent family history.  Social History   Socioeconomic History   Marital status: Widowed    Spouse name: Not on file   Number of children: 2   Years of education: Not on file   Highest education level: Not on file  Occupational History   Occupation: Retired (5 years as of 02/18/21)-     Comment: worked at [employer] in [employer]  Tobacco Use   Smoking status: Every Day    Packs/day: 0.50    Years: 40.00    Pack years: 20.00    Types: Cigarettes    Start date: 11/12/1966   Smokeless tobacco: Never  Vaping Use   Vaping Use: Never used  Substance and Sexual Activity   Alcohol use: Yes    Alcohol/week: 5.0 standard drinks    Types: 5 Cans of beer per week   Drug use: No   Sexual activity: Not Currently    Birth control/protection: None  Other Topics Concern   Not on file  Social History Narrative   Not on file   Social Determinants of Health   Financial Resource Strain: Not on file  Food Insecurity: Not on file  Transportation Needs: Not on file  Physical Activity: Not on file  Stress: Not on file  Social Connections: Not on file  Intimate Partner Violence: Not on file    Outpatient Medications Prior to Visit  Medication Sig Dispense Refill   aspirin 81 MG tablet Take 81 mg by mouth daily.     atorvastatin (LIPITOR) 80 MG tablet TAKE 1 TABLET EVERY DAY WITH BREAKFAST 90 tablet 3   cetirizine (ZYRTEC) 10 MG tablet Take 1 tablet (10 mg total) by mouth  daily. 30 tablet 11   hydrochlorothiazide (HYDRODIURIL) 25 MG tablet TAKE 1 TABLET EVERY DAY 90 tablet 3   lisinopril (ZESTRIL) 10 MG tablet TAKE 1 TABLET EVERY DAY 90 tablet 3   No facility-administered medications prior to visit.    No Known Allergies  Review of Systems  Constitutional: Negative.   Respiratory: Negative.    Cardiovascular: Negative.   Musculoskeletal: Negative.   Psychiatric/Behavioral: Negative.        Objective:    Physical Exam Constitutional:      Appearance: Normal appearance.  Cardiovascular:     Rate and Rhythm: Normal rate and regular rhythm.     Pulses: Normal pulses.     Heart sounds: Normal heart sounds.  Pulmonary:     Effort: Pulmonary effort is normal.     Breath sounds: Normal breath sounds.  Musculoskeletal:        General: Normal range of motion.   Neurological:     Mental Status: He is alert.  Psychiatric:        Mood and Affect: Mood normal.        Behavior: Behavior normal.        Thought Content: Thought content normal.        Judgment: Judgment normal.    BP (!) 145/79 (BP Location: Right Arm, Patient Position: Sitting, Cuff Size: Normal)   Pulse 82   Temp 98.5 F (36.9 C) (Oral)   Ht 5\' 9"  (1.753 m)   Wt 143 lb 0.6 oz (64.9 kg)   SpO2 97%   BMI 21.12 kg/m  Wt Readings from Last 3 Encounters:  03/20/21 143 lb 0.6 oz (64.9 kg)  02/18/21 142 lb (64.4 kg)  07/04/20 143 lb 9.6 oz (65.1 kg)    Health Maintenance Due  Topic Date Due   COVID-19 Vaccine (1) Never done   Hepatitis C Screening  Never done   Zoster Vaccines- Shingrix (1 of 2) Never done   COLONOSCOPY (Pts 45-37yrs Insurance coverage will need to be confirmed)  Never done    There are no preventive care reminders to display for this patient.   Lab Results  Component Value Date   TSH 0.588 12/18/2014   Lab Results  Component Value Date   WBC 8.1 05/12/2016   HGB 13.9 05/12/2016   HCT 41.0 05/12/2016   MCV 94.9 05/12/2016   PLT 260 05/12/2016   Lab Results  Component Value Date   NA 136 05/12/2016   K 5.1 05/12/2016   CO2 25 05/12/2016   GLUCOSE 104 (H) 05/12/2016   BUN 16 05/12/2016   CREATININE 0.89 05/12/2016   BILITOT 0.5 12/18/2014   ALKPHOS 42 12/18/2014   AST 21 12/18/2014   ALT 23 12/18/2014   PROT 6.7 12/18/2014   ALBUMIN 4.2 12/18/2014   CALCIUM 9.5 05/12/2016   ANIONGAP 8 05/12/2016   Lab Results  Component Value Date   CHOL 155 12/18/2014   Lab Results  Component Value Date   HDL 44 12/18/2014   Lab Results  Component Value Date   LDLCALC 59 12/18/2014   Lab Results  Component Value Date   TRIG 259 (H) 12/18/2014   Lab Results  Component Value Date   CHOLHDL 3.5 12/18/2014   Lab Results  Component Value Date   HGBA1C 5.8 (H) 12/18/2014       Assessment & Plan:   Problem List Items Addressed This  Visit       Cardiovascular and Mediastinum   Hypertension    -  checking labs today        Other   Hyperlipidemia    -checking labs today      Other Visit Diagnoses     Chronic fatigue    -  Primary   Relevant Orders   VITAMIN D 25 Hydroxy (Vit-D Deficiency, Fractures)   TSH   B12        No orders of the defined types were placed in this encounter.    Heather Roberts, NP

## 2021-03-20 NOTE — Assessment & Plan Note (Signed)
-  checking labs today 

## 2021-03-20 NOTE — Patient Instructions (Signed)
Please have fasting labs drawn today. 

## 2021-03-21 LAB — CMP14+EGFR
ALT: 27 IU/L (ref 0–44)
AST: 29 IU/L (ref 0–40)
Albumin/Globulin Ratio: 1.8 (ref 1.2–2.2)
Albumin: 4.6 g/dL (ref 3.7–4.7)
Alkaline Phosphatase: 67 IU/L (ref 44–121)
BUN/Creatinine Ratio: 14 (ref 10–24)
BUN: 11 mg/dL (ref 8–27)
Bilirubin Total: 0.6 mg/dL (ref 0.0–1.2)
CO2: 21 mmol/L (ref 20–29)
Calcium: 9 mg/dL (ref 8.6–10.2)
Chloride: 99 mmol/L (ref 96–106)
Creatinine, Ser: 0.76 mg/dL (ref 0.76–1.27)
Globulin, Total: 2.5 g/dL (ref 1.5–4.5)
Glucose: 90 mg/dL (ref 70–99)
Potassium: 5.1 mmol/L (ref 3.5–5.2)
Sodium: 135 mmol/L (ref 134–144)
Total Protein: 7.1 g/dL (ref 6.0–8.5)
eGFR: 95 mL/min/{1.73_m2} (ref >59)

## 2021-03-21 LAB — CBC WITH DIFFERENTIAL/PLATELET
Basophils Absolute: 0.1 10*3/uL (ref 0.0–0.2)
Basos: 1 %
EOS (ABSOLUTE): 0.4 10*3/uL (ref 0.0–0.4)
Eos: 6 %
Hematocrit: 46 % (ref 37.5–51.0)
Hemoglobin: 15.6 g/dL (ref 13.0–17.7)
Immature Grans (Abs): 0 10*3/uL (ref 0.0–0.1)
Immature Granulocytes: 1 %
Lymphocytes Absolute: 1.7 10*3/uL (ref 0.7–3.1)
Lymphs: 27 %
MCH: 32.8 pg (ref 26.6–33.0)
MCHC: 33.9 g/dL (ref 31.5–35.7)
MCV: 97 fL (ref 79–97)
Monocytes Absolute: 0.5 10*3/uL (ref 0.1–0.9)
Monocytes: 8 %
Neutrophils Absolute: 3.7 10*3/uL (ref 1.4–7.0)
Neutrophils: 57 %
Platelets: 180 10*3/uL (ref 150–450)
RBC: 4.75 x10E6/uL (ref 4.14–5.80)
RDW: 13.3 % (ref 11.6–15.4)
WBC: 6.4 10*3/uL (ref 3.4–10.8)

## 2021-03-21 LAB — HEPATITIS C ANTIBODY: Hep C Virus Ab: <0.1 s/co ratio (ref 0.0–0.9)

## 2021-03-21 LAB — LIPID PANEL WITH LDL/HDL RATIO
Cholesterol, Total: 116 mg/dL (ref 100–199)
HDL: 50 mg/dL (ref >39)
LDL Chol Calc (NIH): 39 mg/dL (ref 0–99)
LDL/HDL Ratio: 0.8 ratio (ref 0.0–3.6)
Triglycerides: 163 mg/dL — ABNORMAL HIGH (ref 0–149)
VLDL Cholesterol Cal: 27 mg/dL (ref 5–40)

## 2021-03-21 LAB — VITAMIN D 25 HYDROXY (VIT D DEFICIENCY, FRACTURES): Vit D, 25-Hydroxy: 10 ng/mL — ABNORMAL LOW (ref 30.0–100.0)

## 2021-03-21 LAB — VITAMIN B12: Vitamin B-12: 330 pg/mL (ref 232–1245)

## 2021-03-21 LAB — TSH: TSH: 0.471 u[IU]/mL (ref 0.450–4.500)

## 2021-03-23 ENCOUNTER — Other Ambulatory Visit: Payer: Self-pay | Admitting: Nurse Practitioner

## 2021-03-23 DIAGNOSIS — E559 Vitamin D deficiency, unspecified: Secondary | ICD-10-CM

## 2021-03-23 MED ORDER — VITAMIN D (ERGOCALCIFEROL) 1.25 MG (50000 UNIT) PO CAPS
50000.0000 [IU] | ORAL_CAPSULE | ORAL | 0 refills | Status: DC
Start: 2021-03-23 — End: 2021-09-21

## 2021-03-23 NOTE — Progress Notes (Signed)
His Vit D is low, so I will call in a rx for Vit D.

## 2021-03-24 ENCOUNTER — Telehealth: Payer: Self-pay

## 2021-03-24 NOTE — Telephone Encounter (Signed)
Spoke with pt gave results of low vit d and told him he had an rx at his pharmacy he stated understanding

## 2021-03-24 NOTE — Telephone Encounter (Signed)
Patient called asking if anything will be called into his pharmacy.  He was just seen not having no energy, waiting to hear back from his bloodwork. Please contact patient back # [phone].

## 2021-04-16 ENCOUNTER — Other Ambulatory Visit: Payer: Self-pay

## 2021-04-16 ENCOUNTER — Ambulatory Visit (INDEPENDENT_AMBULATORY_CARE_PROVIDER_SITE_OTHER): Payer: Medicare HMO | Admitting: *Deleted

## 2021-04-16 DIAGNOSIS — Z0000 Encounter for general adult medical examination without abnormal findings: Secondary | ICD-10-CM | POA: Diagnosis not present

## 2021-04-16 NOTE — Progress Notes (Signed)
Subjective:   [name] is a 75 y.o. male who presents for Medicare Annual/Subsequent preventive examination. I connected with  AEON KESSNER on 04/16/21 by audio enabled telemedicine application and verified that I am speaking with the correct person using two identifiers.   I discussed the limitations of evaluation and management by telemedicine. The patient expressed understanding and agreed to proceed.   Review of Systems           Objective:    There were no vitals filed for this visit. There is no height or weight on file to calculate BMI.  Advanced Directives 07/16/2016 05/14/2016 05/12/2016  Does Patient Have a Medical Advance Directive? No No No  Would patient like information on creating a medical advance directive? No - Patient declined No - Patient declined No - Patient declined    Current Medications (verified) Outpatient Encounter Medications as of 04/16/2021  Medication Sig   aspirin 81 MG tablet Take 81 mg by mouth daily.   atorvastatin (LIPITOR) 80 MG tablet TAKE 1 TABLET EVERY DAY WITH BREAKFAST   cetirizine (ZYRTEC) 10 MG tablet Take 1 tablet (10 mg total) by mouth daily.   hydrochlorothiazide (HYDRODIURIL) 25 MG tablet TAKE 1 TABLET EVERY DAY   lisinopril (ZESTRIL) 10 MG tablet TAKE 1 TABLET EVERY DAY   Vitamin D, Ergocalciferol, (DRISDOL) 1.25 MG (50000 UNIT) CAPS capsule Take 1 capsule (50,000 Units total) by mouth every 7 (seven) days.   No facility-administered encounter medications on file as of 04/16/2021.    Allergies (verified) Patient has no known allergies.   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    Arthritis    Erectile  dysfunction    Gout    acute gouty arthropathy   Hyperlipidemia    Hypertension    Tobacco abuse    60 pack years continuing at one pack per day    Past Surgical History:  Procedure Laterality Date   CARDIAC CATHETERIZATION     2 stents inserted   CATARACT EXTRACTION W/PHACO Left 05/14/2016   Procedure: CATARACT EXTRACTION PHACO AND INTRAOCULAR LENS PLACEMENT ([facility]);  Surgeon: Fabio Pierce, MD;  Location: [facility];  Service: Ophthalmology;  Laterality: Left;  CDE: 12.39   CATARACT EXTRACTION W/PHACO Right 07/16/2016   Procedure: CATARACT EXTRACTION PHACO AND INTRAOCULAR LENS PLACEMENT ([facility]);  Surgeon: Fabio Pierce, MD;  Location: [facility];  Service: Ophthalmology;  Laterality: Right;  CDE: 17.59   CIRCUMCISION     SKIN LESION EXCISION Left    left leg and back.   No family history on file. Social History   Socioeconomic History   Marital status: Widowed    Spouse name: Not on file   Number of children: 2   Years of education: Not on file   Highest education level: Not on file  Occupational History   Occupation: Retired (5 years as of 02/18/21)-    Comment: worked at [employer] in [employer]  Tobacco Use   Smoking status: Every Day    Packs/day: 0.50    Years: 40.00    Pack years: 20.00    Types: Cigarettes    Start date: 11/12/1966   Smokeless tobacco: Never  Vaping Use  Vaping Use: Never used  Substance and Sexual Activity   Alcohol use: Yes    Alcohol/week: 5.0 standard drinks    Types: 5 Cans of beer per week   Drug use: No   Sexual activity: Not Currently    Birth control/protection: None  Other Topics Concern   Not on file  Social History Narrative   Not on file   Social Determinants of Health   Financial Resource Strain: Not on file  Food Insecurity: Not on file  Transportation Needs: Not on file  Physical Activity: Not on file  Stress: Not on file  Social Connections: Not on file    Tobacco Counseling Ready to quit: Not Answered Counseling given:  Not Answered   Clinical Intake:                 Diabetic?No         Activities of Daily Living No flowsheet data found.  Patient Care Team: Heather Roberts, NP as PCP - General (Nurse Practitioner) Antoine Poche, MD as PCP - Cardiology (Cardiology) Wyline Mood Dorothe Pea, MD as Consulting Physician (Cardiology)  Indicate any recent Medical Services you may have received from other than [facility] providers in the past year (date may be approximate).     Assessment:   This is a routine wellness examination for [name].  Hearing/Vision screen No results found.  Dietary issues and exercise activities discussed:     Goals Addressed   None   Depression Screen PHQ 2/9 Scores 03/20/2021 02/18/2021  PHQ - 2 Score 0 0    Fall Risk Fall Risk  03/20/2021 02/18/2021  Falls in the past year? 0 0  Number falls in past yr: 0 0  Injury with Fall? 0 0  Risk for fall due to : No Fall Risks No Fall Risks  Follow up Falls evaluation completed Falls evaluation completed    FALL RISK PREVENTION PERTAINING TO THE HOME:  Any stairs in or around the home? No  If so, are there any without handrails? No  Home free of loose throw rugs in walkways, pet beds, electrical cords, etc? No  Adequate lighting in your home to reduce risk of falls? Yes   ASSISTIVE DEVICES UTILIZED TO PREVENT FALLS:  Life alert? No  Use of a cane, [name] or w/c? No  Grab bars in the bathroom? Yes  Shower chair or bench in shower? No  Elevated toilet seat or a handicapped toilet? No   TIMED UP AND GO:  Was the test performed? No .  Length of time to ambulate 10 feet: NA sec.     Cognitive Function:        Immunizations Immunization History  Administered Date(s) Administered   Fluad Quad(high Dose 65+) 03/20/2021   [name] (J&J) SARS-COV-2 Vaccination 11/25/2019, 05/01/2020    TDAP status: Due, Education has been provided regarding the importance of this vaccine. Advised may receive this vaccine  at local pharmacy or Health Dept. Aware to provide a copy of the vaccination record if obtained from local pharmacy or Health Dept. Verbalized acceptance and understanding.  Flu Vaccine status: Up to date  Pneumococcal vaccine status: Due, Education has been provided regarding the importance of this vaccine. Advised may receive this vaccine at local pharmacy or Health Dept. Aware to provide a copy of the vaccination record if obtained from local pharmacy or Health Dept. Verbalized acceptance and understanding.  Covid-19 vaccine status: Completed vaccines  Qualifies for Shingles Vaccine? Yes   Zostavax completed No  Shingrix Completed?: No.    Education has been provided regarding the importance of this vaccine. Patient has been advised to call insurance company to determine out of pocket expense if they have not yet received this vaccine. Advised may also receive vaccine at local pharmacy or Health Dept. Verbalized acceptance and understanding.  Screening Tests Health Maintenance  Topic Date Due   Pneumonia Vaccine 31+ Years old (1 - PCV) Never done   Zoster Vaccines- Shingrix (1 of 2) Never done   COLONOSCOPY (Pts 45-61yrs Insurance coverage will need to be confirmed)  Never done   COVID-19 Vaccine (3 - Booster for [employer] series) 06/26/2020   TETANUS/TDAP  02/18/2022 (Originally [birth year]/02/1966)   INFLUENZA VACCINE  Completed   Hepatitis C Screening  Completed   HPV VACCINES  Aged Out    Health Maintenance  Health Maintenance Due  Topic Date Due   Pneumonia Vaccine 89+ Years old (1 - PCV) Never done   Zoster Vaccines- Shingrix (1 of 2) Never done   COLONOSCOPY (Pts 45-12yrs Insurance coverage will need to be confirmed)  Never done   COVID-19 Vaccine (3 - Booster for [employer] series) 06/26/2020    Colorectal cancer screening: No longer required.   Lung Cancer Screening: (Low Dose CT Chest recommended if Age 44-80 years, 30 pack-year currently smoking OR have quit w/in 15years.) does  qualify.   Lung Cancer Screening Referral: Discussed with patient, he is not interested in referral at this time. He will call our office if interested in future.  Additional Screening:  Hepatitis C Screening: does qualify; Completed 03/20/2021  Vision Screening: Recommended annual ophthalmology exams for early detection of glaucoma and other disorders of the eye. Is the patient up to date with their annual eye exam?  Yes  Who is the provider or what is the name of the office in which the patient attends annual eye exams? Patient is established with eye Dr. But does not recall name If pt is not established with a provider, would they like to be referred to a provider to establish care? No .   Dental Screening: Recommended annual dental exams for proper oral hygiene  Community Resource Referral / Chronic Care Management: CRR required this visit?  No   CCM required this visit?  No      Plan:     I have personally reviewed and noted the following in the patient's chart:   Medical and social history Use of alcohol, tobacco or illicit drugs  Current medications and supplements including opioid prescriptions. Patient is not currently taking opioid prescriptions. Functional ability and status Nutritional status Physical activity Advanced directives List of other physicians Hospitalizations, surgeries, and ER visits in previous 12 months Vitals Screenings to include cognitive, depression, and falls Referrals and appointments  In addition, I have reviewed and discussed with patient certain preventive protocols, quality metrics, and best practice recommendations. A written personalized care plan for preventive services as well as general preventive health recommendations were provided to patient.     Sydnee Levans, CMA   04/16/2021   Nurse Notes: This was a telehealth visit. The patient was at home, the provider was in the office. Nicholas Platt, MD

## 2021-04-16 NOTE — Patient Instructions (Signed)
[name] , Thank you for taking time to come for your Medicare Wellness Visit. I appreciate your ongoing commitment to your health goals. Please review the following plan we discussed and let me know if I can assist you in the future.   Screening recommendations/referrals: Colonoscopy: Aged out Recommended yearly ophthalmology/optometry visit for glaucoma screening and checkup Recommended yearly dental visit for hygiene and checkup  Vaccinations: Influenza vaccine: Completed 03/20/2021 Pneumococcal vaccine: Due now, information discussed with patient. Tdap vaccine: Due now, information discussed with patient. Shingles vaccine: Due now, information discussed with patient    Advanced directives: Information provided to patient, Advanced Directive packet was placed in mail for patient to review.   Conditions/risks identified: Hypertension, Hyperlipidemia  Next appointment: 1 year  Preventive Care 44 Years and Older, Male Preventive care refers to lifestyle choices and visits with your health care provider that can promote health and wellness. What does preventive care include? A yearly physical exam. This is also called an annual well check. Dental exams once or twice a year. Routine eye exams. Ask your health care provider how often you should have your eyes checked. Personal lifestyle choices, including: Daily care of your teeth and gums. Regular physical activity. Eating a healthy diet. Avoiding tobacco and drug use. Limiting alcohol use. Practicing safe sex. Taking low doses of aspirin every day. Taking vitamin and mineral supplements as recommended by your health care provider. What happens during an annual well check? The services and screenings done by your health care provider during your annual well check will depend on your age, overall health, lifestyle risk factors, and family history of disease. Counseling  Your health care provider may ask you questions about  your: Alcohol use. Tobacco use. Drug use. Emotional well-being. Home and relationship well-being. Sexual activity. Eating habits. History of falls. Memory and ability to understand (cognition). Work and work Astronomer. Screening  You may have the following tests or measurements: Height, weight, and BMI. Blood pressure. Lipid and cholesterol levels. These may be checked every 5 years, or more frequently if you are over 69 years old. Skin check. Lung cancer screening. You may have this screening every year starting at age 59 if you have a 30-pack-year history of smoking and currently smoke or have quit within the past 15 years. Fecal occult blood test (FOBT) of the stool. You may have this test every year starting at age 43. Flexible sigmoidoscopy or colonoscopy. You may have a sigmoidoscopy every 5 years or a colonoscopy every 10 years starting at age 61. Prostate cancer screening. Recommendations will vary depending on your family history and other risks. Hepatitis C blood test. Hepatitis B blood test. Sexually transmitted disease (STD) testing. Diabetes screening. This is done by checking your blood sugar (glucose) after you have not eaten for a while (fasting). You may have this done every 1-3 years. Abdominal aortic aneurysm (AAA) screening. You may need this if you are a current or former smoker. Osteoporosis. You may be screened starting at age 60 if you are at high risk. Talk with your health care provider about your test results, treatment options, and if necessary, the need for more tests. Vaccines  Your health care provider may recommend certain vaccines, such as: Influenza vaccine. This is recommended every year. Tetanus, diphtheria, and acellular pertussis (Tdap, Td) vaccine. You may need a Td booster every 10 years. Zoster vaccine. You may need this after age 32. Pneumococcal 13-valent conjugate (PCV13) vaccine. One dose is recommended after age 49. Pneumococcal  polysaccharide (PPSV23) vaccine. One dose is recommended after age 30. Talk to your health care provider about which screenings and vaccines you need and how often you need them. This information is not intended to replace advice given to you by your health care provider. Make sure you discuss any questions you have with your health care provider. Document Released: 06/27/2015 Document Revised: 02/18/2016 Document Reviewed: 04/01/2015 Elsevier Interactive Patient Education  2017 [employer] Prevention in the Home Falls can cause injuries. They can happen to people of all ages. There are many things you can do to make your home safe and to help prevent falls. What can I do on the outside of my home? Regularly fix the edges of walkways and driveways and fix any cracks. Remove anything that might make you trip as you walk through a door, such as a raised step or threshold. Trim any bushes or trees on the path to your home. Use bright outdoor lighting. Clear any walking paths of anything that might make someone trip, such as rocks or tools. Regularly check to see if handrails are loose or broken. Make sure that both sides of any steps have handrails. Any raised decks and porches should have guardrails on the edges. Have any leaves, snow, or ice cleared regularly. Use sand or salt on walking paths during winter. Clean up any spills in your garage right away. This includes oil or grease spills. What can I do in the bathroom? Use night lights. Install grab bars by the toilet and in the tub and shower. Do not use towel bars as grab bars. Use non-skid mats or decals in the tub or shower. If you need to sit down in the shower, use a plastic, non-slip stool. Keep the floor dry. Clean up any water that spills on the floor as soon as it happens. Remove soap buildup in the tub or shower regularly. Attach bath mats securely with double-sided non-slip rug tape. Do not have throw rugs and other  things on the floor that can make you trip. What can I do in the bedroom? Use night lights. Make sure that you have a light by your bed that is easy to reach. Do not use any sheets or blankets that are too big for your bed. They should not hang down onto the floor. Have a firm chair that has side arms. You can use this for support while you get dressed. Do not have throw rugs and other things on the floor that can make you trip. What can I do in the kitchen? Clean up any spills right away. Avoid walking on wet floors. Keep items that you use a lot in easy-to-reach places. If you need to reach something above you, use a strong step stool that has a grab bar. Keep electrical cords out of the way. Do not use floor polish or wax that makes floors slippery. If you must use wax, use non-skid floor wax. Do not have throw rugs and other things on the floor that can make you trip. What can I do with my stairs? Do not leave any items on the stairs. Make sure that there are handrails on both sides of the stairs and use them. Fix handrails that are broken or loose. Make sure that handrails are as long as the stairways. Check any carpeting to make sure that it is firmly attached to the stairs. Fix any carpet that is loose or worn. Avoid having throw rugs at the top or  bottom of the stairs. If you do have throw rugs, attach them to the floor with carpet tape. Make sure that you have a light switch at the top of the stairs and the bottom of the stairs. If you do not have them, ask someone to add them for you. What else can I do to help prevent falls? Wear shoes that: Do not have high heels. Have rubber bottoms. Are comfortable and fit you well. Are closed at the toe. Do not wear sandals. If you use a stepladder: Make sure that it is fully opened. Do not climb a closed stepladder. Make sure that both sides of the stepladder are locked into place. Ask someone to hold it for you, if possible. Clearly  mark and make sure that you can see: Any grab bars or handrails. First and last steps. Where the edge of each step is. Use tools that help you move around (mobility aids) if they are needed. These include: Canes. Walkers. Scooters. Crutches. Turn on the lights when you go into a dark area. Replace any light bulbs as soon as they burn out. Set up your furniture so you have a clear path. Avoid moving your furniture around. If any of your floors are uneven, fix them. If there are any pets around you, be aware of where they are. Review your medicines with your doctor. Some medicines can make you feel dizzy. This can increase your chance of falling. Ask your doctor what other things that you can do to help prevent falls. This information is not intended to replace advice given to you by your health care provider. Make sure you discuss any questions you have with your health care provider. Document Released: 03/27/2009 Document Revised: 11/06/2015 Document Reviewed: 07/05/2014 Elsevier Interactive Patient Education  2017 [employer].

## 2021-04-22 ENCOUNTER — Other Ambulatory Visit: Payer: Self-pay | Admitting: Cardiology

## 2021-05-28 ENCOUNTER — Other Ambulatory Visit: Payer: Self-pay

## 2021-05-28 DIAGNOSIS — Z9109 Other allergy status, other than to drugs and biological substances: Secondary | ICD-10-CM

## 2021-05-28 MED ORDER — CETIRIZINE HCL 10 MG PO TABS: 10.0000 mg | ORAL_TABLET | Freq: Every day | ORAL | 11 refills | Status: DC

## 2021-08-03 ENCOUNTER — Telehealth: Payer: Self-pay | Admitting: Cardiology

## 2021-08-03 NOTE — Telephone Encounter (Signed)
° °  Pre-operative Risk Assessment    Patient Name: [name]  DOB: 1946-01-02 MRN: [id number]     Request for Surgical Clearance    Procedure:  Dental Extraction - Amount of Teeth to be Pulled:  9 teeth pulled and 2 partial's  Date of Surgery:  Clearance 08/03/21                                 Surgeon:  Dr. Annette Stable, DDS Surgeon's Group or Practice Name:  [facility] Phone number:  [phone] Fax number:  N/A - Susie states that she will fax [place] a clearance as high priority   Type of Clearance Requested:   - Medical    Type of Anesthesia:     Additional requests/questions:  Does this patient need antibiotics?   Patient has procedure planned for today at 12:30pm  Signed, Garald Braver   08/03/2021, 10:31 AM

## 2021-08-03 NOTE — Telephone Encounter (Signed)
° °  Name: [name]  DOB: Apr 26, 1946  MRN: [id number]   Primary Cardiologist: Dina Rich, MD  Chart reviewed as part of pre-operative protocol coverage.  [name] was last seen on 07/04/20 by Dr. Wyline Mood.    An urgent request has been received with the patient in the dental chair at [facility] for medical clearance for extraction of 9 teeth. SBE PPX recommendations are also requested.   The patient has not been seen in the past 12 months. Per protocol, I am unable to provide medical clearance for extractions.   The patient does not have an indication for SBE PPX from a cardiac standpoint.   I will route this recommendation to the requesting party via Epic fax function and remove from pre-op pool. Please call with questions.  Roe Rutherford Shacoya Burkhammer, PA 08/03/2021, 11:00 AM

## 2021-08-03 NOTE — Telephone Encounter (Signed)
I s/w the pt and he is agreeable to plan of care for appt with Dr. Wyline Mood. Pt is scheduled to see Dr. Wyline Mood 08/04/21 @ 2:20. I will update the dental office. I will forward notes to MD for upcoming appt 08/04/21.

## 2021-08-03 NOTE — Telephone Encounter (Signed)
I s/w the Dental office and informed them the procedure will need to be postponed until the pt has been seen by cardiology. Pt has appt 08/04/21 @ 2:20 with Dr. Wyline Mood in the [place] office per request of the pt for [facility] office.   I did get a fax # for [facility] [phone].

## 2021-08-04 ENCOUNTER — Encounter: Payer: Self-pay | Admitting: Cardiology

## 2021-08-04 ENCOUNTER — Other Ambulatory Visit: Payer: Self-pay

## 2021-08-04 ENCOUNTER — Ambulatory Visit: Payer: Medicare HMO | Admitting: Cardiology

## 2021-08-04 VITALS — BP 122/60 | HR 84 | Ht 69.0 in | Wt 140.2 lb

## 2021-08-04 DIAGNOSIS — I2510 Atherosclerotic heart disease of native coronary artery without angina pectoris: Secondary | ICD-10-CM

## 2021-08-04 DIAGNOSIS — E782 Mixed hyperlipidemia: Secondary | ICD-10-CM | POA: Diagnosis not present

## 2021-08-04 DIAGNOSIS — Z01810 Encounter for preprocedural cardiovascular examination: Secondary | ICD-10-CM

## 2021-08-04 DIAGNOSIS — I10 Essential (primary) hypertension: Secondary | ICD-10-CM | POA: Diagnosis not present

## 2021-08-04 NOTE — Progress Notes (Signed)
Clinical Summary [name] is a 76 y.o.male seen today for follow up of the following medical problems.    1. CAD - prior inferior MI in 2008, received BMS to RCA - NSTEMI 07/2008 due to RCA ISR, treated with cutting balloon and additional DES. LVEF 60% by LV gram at that time.   - no recent chest pain. No SOB/DOE - compliant with meds     2. HTN -compliant with meds   3. Hyperlipidemia -rcompliant with statin - has not gone for labs that were ordered at last visit  03/2021 TC 116 TG 163 HDL 50 LDL 39    4.Preop evaluation - needs dental surgery, multiple extractions.  - ok from cardiac standpoint, low risk procedure.  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     atorvastatin (LIPITOR) 80 MG tablet TAKE 1 TABLET EVERY DAY WITH BREAKFAST 90 tablet 3   cetirizine (ZYRTEC) 10 MG tablet Take 1 tablet (10 mg total) by mouth daily. 30 tablet 11   hydrochlorothiazide (HYDRODIURIL) 25 MG tablet TAKE 1 TABLET EVERY DAY 90 tablet 3   lisinopril (ZESTRIL) 10 MG tablet TAKE 1 TABLET EVERY DAY 90 tablet 3   Vitamin D, Ergocalciferol, (DRISDOL) 1.25 MG (50000 UNIT) CAPS capsule Take 1 capsule (50,000 Units total) by mouth every 7 (seven) days. 12 capsule 0    No current facility-administered medications for this visit.     Past Surgical History:  Procedure Laterality Date   CARDIAC CATHETERIZATION     2 stents inserted   CATARACT EXTRACTION W/PHACO Left 05/14/2016   Procedure: CATARACT EXTRACTION PHACO AND INTRAOCULAR LENS PLACEMENT (IOC);  Surgeon: Fabio Pierce, MD;  Location: [facility];  Service: Ophthalmology;  Laterality: Left;  CDE: 12.39   CATARACT EXTRACTION W/PHACO Right 07/16/2016   Procedure: CATARACT EXTRACTION PHACO AND INTRAOCULAR LENS PLACEMENT (IOC);  Surgeon: Fabio Pierce, MD;  Location: [facility];  Service: Ophthalmology;  Laterality: Right;  CDE: 17.59   CIRCUMCISION     SKIN LESION EXCISION Left    left leg and back.     No Known Allergies    No family history on file.   Social History [name] reports that he has been smoking cigarettes. He started smoking about 54 years ago. He has a 20.00 pack-year smoking history. He has never used smokeless tobacco. [name] reports current alcohol use of about 5.0 standard drinks per week.   Review of Systems CONSTITUTIONAL: No weight loss, fever, chills, weakness or fatigue.  HEENT: Eyes: No visual loss, blurred vision, double vision or yellow sclerae.No hearing loss, sneezing, congestion, runny nose or sore throat.  SKIN: No  rash or itching.  CARDIOVASCULAR: per hpi RESPIRATORY: No shortness of breath, cough or sputum.  GASTROINTESTINAL: No anorexia, nausea, vomiting or diarrhea. No abdominal pain or blood.  GENITOURINARY: No burning on urination, no polyuria NEUROLOGICAL: No headache, dizziness, syncope, paralysis, ataxia, numbness or tingling in the extremities. No change in bowel or bladder control.  MUSCULOSKELETAL: No muscle, back pain, joint pain or stiffness.  LYMPHATICS: No enlarged nodes. No history of splenectomy.  PSYCHIATRIC: No history of depression or anxiety.  ENDOCRINOLOGIC: No reports of sweating, cold or heat intolerance. No polyuria or polydipsia.   Marland Kitchen   Physical Examination Today's Vitals   08/04/21 1411  BP: 122/60  Pulse: 84  SpO2: 98%  Weight: 140 lb 3.2 oz (63.6 kg)  Height: 5\' 9"  (1.753 m)   Body mass index is 20.7 kg/m.  Gen: resting comfortably, no acute distress HEENT: no scleral icterus, pupils equal round and reactive, no palptable cervical adenopathy,  CV: RRR, no m/r/g, no jvd Resp: Clear to auscultation bilaterally GI: abdomen is soft, non-tender, non-distended, normal bowel sounds, no hepatosplenomegaly MSK: extremities are warm, no edema.  Skin: warm, no rash Neuro:  no focal deficits Psych: appropriate affect   Diagnostic Studies  07/2008 Cath RESULTS: Aortic pressure was 115/54 with mean of 85. Left index   pressure was 115/10.   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   Left anterior descending artery: The left anterior descending artery   gave rise to a large diagonal Yolani Vo, septal perforator, and 2 small   diagonal branches. There was 40% narrowing in the proximal LAD after   the diagonal Irven Ingalsbe and there was 50% narrowing in the very proximal and   ostium of the first diagonal Hazem Kenner. There was moderate calcification   and no irregularities.   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 This was a small vessel that was diffusely   diseased with 70-80% stenoses in its proximal to midportion. It filled   2 small marginal branches. This vessel was also significantly diseased   on the prior study.   Right coronary artery: The right coronary artery was a moderately large   vessel that gave rise to a posterior descending Khaliyah Northrop and a large   posterolateral Mort Smelser. There were multiple right angle turns in the   proximal vessel and there was a long stent in the midvessel which   traversed to a right angle bend. There was a 90% focal stenosis just   before the second bend  and the midvessel. There was also 70-80%   narrowing in the distal right coronary artery which extended just to the   posterior descending Demond Shallenberger. There was 60% narrowing in the   posterolateral Kohner Orlick.   Left ventriculogram: The left ventriculogram performed in the RAO   projection showed hypokinesis of the inferobasal segment. The overall   wall motion was good with an estimated ejection fraction of 60%.   Following cutting balloon angioplasty, the lesion within the stent in   the mid-right coronary stenosis improved from 90% to 10%.   Following stenting of the lesion, the distal right coronary stenosis   improved from 80% to 0%..   CONCLUSION:   1. 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   2. Successful percutaneous coronary intervention of the in-stent   restenotic lesion in the mid-right coronary using a cutting balloon   angioplasty with improvement in central narrowing from 90% to 10%.   3. Successful percutaneous coronary intervention of the lesion in the   distal right coronary using a Xience drug-eluting stent with   improvement in central narrowing from 80% to 0%.   DISPOSITION: The patient returned to the [facility] room for further   observation. Recommend Plavix for at least a year.         10/19/13 Clinic EKG NSR   10/2013 AAA US IMPRESSION: No evidence of abdominal aortic aneurysm .   Assessment and Plan  1. CAD/ASCVD - no symptoms, continue current meds - EKG shows SR, no ischemic changes   2. HTN - he is at goal, continue current meds    3. Hyperlipidemia -LDL at goal, continue current meds   4. Preoperative evaluation - plans for oral surgery. Low risk procedure, no contraindication from cardiac standpoint. Recommend proceeding. No pre antiobiotics are indicated. Continue aspirin, if absolutely to hold can hold 7 days prior resume day after    F/u 1 year  Antoine Poche, M.D.

## 2021-08-04 NOTE — Telephone Encounter (Signed)
Ok to proceed with dental procedure. No pre antibiotic is needed   Dominga Ferry MD

## 2021-08-04 NOTE — Patient Instructions (Signed)
Medication Instructions:  Your physician recommends that you continue on your current medications as directed. Please refer to the Current Medication list given to you today.  Labwork: none  Testing/Procedures: none  Follow-Up: Your physician recommends that you schedule a follow-up appointment in: 1 year.   Any Other Special Instructions Will Be Listed Below (If Applicable).  If you need a refill on your cardiac medications before your next appointment, please call your pharmacy. 

## 2021-08-04 NOTE — Telephone Encounter (Signed)
° °  Patient Name: [name]  DOB: 10/11/1945 MRN: [id number]  Primary Cardiologist: Dina Rich, MD  Chart reviewed as part of pre-operative protocol coverage. Dr. Wyline Mood saw patient in clinic today, will route his office note to requesting party. To summarize his recommendation:  "4. Preoperative evaluation - plans for oral surgery. Low risk procedure, no contraindication from cardiac standpoint. Recommend proceeding. No pre antiobiotics are indicated. Continue aspirin, if absolutely to hold can hold 7 days prior resume day after"  Will route this bundled recommendation to requesting provider via Epic fax function. Please call with questions.  Laurann Montana, PA-C 08/04/2021, 5:09 PM

## 2021-08-10 ENCOUNTER — Ambulatory Visit: Payer: Medicare HMO | Admitting: Physician Assistant

## 2021-08-17 ENCOUNTER — Other Ambulatory Visit: Payer: Self-pay | Admitting: Cardiology

## 2021-09-18 ENCOUNTER — Ambulatory Visit: Payer: Medicare HMO | Admitting: Nurse Practitioner

## 2021-09-21 ENCOUNTER — Ambulatory Visit (INDEPENDENT_AMBULATORY_CARE_PROVIDER_SITE_OTHER): Payer: Medicare HMO | Admitting: Internal Medicine

## 2021-09-21 ENCOUNTER — Encounter: Payer: Self-pay | Admitting: Internal Medicine

## 2021-09-21 VITALS — BP 122/62 | HR 64 | Resp 18 | Ht 69.0 in | Wt 139.8 lb

## 2021-09-21 DIAGNOSIS — I10 Essential (primary) hypertension: Secondary | ICD-10-CM | POA: Diagnosis not present

## 2021-09-21 DIAGNOSIS — E782 Mixed hyperlipidemia: Secondary | ICD-10-CM | POA: Diagnosis not present

## 2021-09-21 DIAGNOSIS — Z125 Encounter for screening for malignant neoplasm of prostate: Secondary | ICD-10-CM

## 2021-09-21 DIAGNOSIS — R7303 Prediabetes: Secondary | ICD-10-CM

## 2021-09-21 DIAGNOSIS — I2510 Atherosclerotic heart disease of native coronary artery without angina pectoris: Secondary | ICD-10-CM | POA: Diagnosis not present

## 2021-09-21 DIAGNOSIS — Z23 Encounter for immunization: Secondary | ICD-10-CM

## 2021-09-21 DIAGNOSIS — Z720 Tobacco use: Secondary | ICD-10-CM | POA: Diagnosis not present

## 2021-09-21 DIAGNOSIS — E559 Vitamin D deficiency, unspecified: Secondary | ICD-10-CM

## 2021-09-21 DIAGNOSIS — R5382 Chronic fatigue, unspecified: Secondary | ICD-10-CM

## 2021-09-21 NOTE — Patient Instructions (Signed)
Please start taking Vitamin D 5000 IU once daily. ? ?Please continue taking other medications as prescribed. ? ?Please try to cut down -> quit smoking. ?

## 2021-09-21 NOTE — Progress Notes (Signed)
? ?Established Patient Office Visit ? ?Subjective:  ?Patient ID: [name], male    DOB: 01-27-1946  Age: 76 y.o. MRN: [id number] ? ?CC:  ?Chief Complaint  ?Patient presents with  ? Follow-up  ?  6 month follow up   ? ? ?HPI ?[name] is a 76 y.o. male with past medical history of CAD, HTN, HLD and tobacco abuse who presents for f/u of his chronic medical conditions. ? ?HTN and CAD: BP is well-controlled. Takes medications regularly. Patient denies headache, dizziness, chest pain, dyspnea or palpitations. ? ?He complains of chronic fatigue, but denies any fever, chills, cough, hemoptysis, night sweats, or LAD.  He smokes about 0.5 pack/day.  He is trying to cut down smoking currently.  Of note, he denies any dyspnea or wheezing currently. ? ?He received PCV20 in the office today. ? ?Past Medical History:  ?Diagnosis Date  ? Arteriosclerotic cardiovascular disease (ASCVD)   ? 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 Arthritis   ? Erectile dysfunction   ? Gout   ? acute gouty arthropathy  ? Hyperlipidemia   ? Hypertension   ? Tobacco abuse   ? 60 pack years continuing at one pack per day   ? ? ?Past Surgical History:  ?Procedure Laterality Date  ? CARDIAC CATHETERIZATION    ? 2 stents inserted  ? CATARACT EXTRACTION W/PHACO Left 05/14/2016  ? Procedure: CATARACT EXTRACTION PHACO AND INTRAOCULAR LENS PLACEMENT (IOC);  Surgeon: Baruch Goldmann, MD;  Location: [facility];  Service: Ophthalmology;  Laterality: Left;  CDE: 12.39  ? CATARACT EXTRACTION W/PHACO Right 07/16/2016  ? Procedure: CATARACT EXTRACTION PHACO AND INTRAOCULAR LENS PLACEMENT (IOC);  Surgeon: Baruch Goldmann, MD;  Location: [facility];  Service: Ophthalmology;  Laterality: Right;  CDE: 17.59  ? CIRCUMCISION    ? SKIN LESION EXCISION Left   ?  left leg and back.  ? ? ?History reviewed. No pertinent family history. ? ?Social History  ? ?Socioeconomic History  ? Marital status: Widowed  ?  Spouse name: Not on file  ? Number of children: 2  ? Years of education: Not on file  ? Highest education level: Not on file  ?Occupational History  ? Occupation: Retired (5 years as of 02/18/21)-  ?  Comment: worked at [employer] in [place]  ?Tobacco Use  ? Smoking status: Every Day  ?  Packs/day: 0.50  ?  Years: 40.00  ?  Pack years: 20.00  ?  Types: Cigarettes  ?  Start date: 11/12/1966  ? Smokeless tobacco: Never  ?Vaping Use  ? Vaping Use: Never used  ?Substance and Sexual Activity  ? Alcohol use: Yes  ?  Alcohol/week: 5.0 standard drinks  ?  Types: 5 Cans of beer per week  ? Drug use: No  ? Sexual activity: Not Currently  ?  Birth control/protection: None  ?Other Topics Concern  ? Not on file  ?Social History Narrative  ? Not on file  ? ?Social Determinants of Health  ? ?Financial Resource Strain: Low Risk   ? Difficulty of Paying Living Expenses: Not hard at all  ?Food Insecurity: No Food Insecurity  ? Worried About Charity fundraiser in the Last Year: Never true  ? Ran Out of Food in the Last Year:  Never true  ?Transportation Needs: No Transportation Needs  ? Lack of Transportation (Medical): No  ? Lack of Transportation (Non-Medical): No  ?Physical Activity: Sufficiently Active  ? Days of Exercise per Week: 6 days  ? Minutes of Exercise per Session: 60 min  ?Stress: No Stress Concern Present  ? Feeling of Stress : Not at all  ?Social Connections: Socially Isolated  ? Frequency of Communication with Friends and Family: Twice a week  ? Frequency of Social Gatherings with Friends and Family: Never  ? Attends Religious Services: Never  ? Active Member of Clubs or Organizations: No  ? Attends Archivist Meetings: Never  ? Marital Status: Widowed  ?Intimate Partner Violence: Not At Risk  ? Fear of Current or Ex-Partner: No  ? Emotionally Abused: No  ?  Physically Abused: No  ? Sexually Abused: No  ? ? ?Outpatient Medications Prior to Visit  ?Medication Sig Dispense Refill  ? aspirin 81 MG tablet Take 81 mg by mouth daily.    ? atorvastatin (LIPITOR) 80 MG tablet TAKE 1 TABLET EVERY DAY WITH BREAKFAST 90 tablet 3  ? cetirizine (ZYRTEC) 10 MG tablet Take 1 tablet (10 mg total) by mouth daily. 30 tablet 11  ? hydrochlorothiazide (HYDRODIURIL) 25 MG tablet TAKE ONE TABLET BY MOUTH ONCE DAILY. 90 tablet 3  ? lisinopril (ZESTRIL) 10 MG tablet TAKE (1) TABLET BY MOUTH ONCE DAILY. 90 tablet 3  ? Vitamin D, Ergocalciferol, (DRISDOL) 1.25 MG (50000 UNIT) CAPS capsule Take 1 capsule (50,000 Units total) by mouth every 7 (seven) days. 12 capsule 0  ? ?No facility-administered medications prior to visit.  ? ? ?No Known Allergies ? ?ROS ?Review of Systems  ?Constitutional:  Positive for fatigue. Negative for chills and fever.  ?HENT:  Negative for congestion and sore throat.   ?Eyes:  Negative for pain and discharge.  ?Respiratory:  Negative for cough and shortness of breath.   ?Cardiovascular:  Negative for chest pain and palpitations.  ?Gastrointestinal:  Negative for constipation, diarrhea, nausea and vomiting.  ?Endocrine: Negative for polydipsia and polyuria.  ?Genitourinary:  Negative for dysuria and hematuria.  ?Musculoskeletal:  Negative for neck pain and neck stiffness.  ?Skin:  Negative for rash.  ?Neurological:  Negative for dizziness, weakness, numbness and headaches.  ?Psychiatric/Behavioral:  Negative for agitation and behavioral problems.   ? ?  ?Objective:  ?  ?Physical Exam ?Vitals reviewed.  ?Constitutional:   ?   General: He is not in acute distress. ?   Appearance: He is not diaphoretic.  ?HENT:  ?   Head: Normocephalic and atraumatic.  ?   Nose: Nose normal.  ?   Mouth/Throat:  ?   Mouth: Mucous membranes are moist.  ?Eyes:  ?   General: No scleral icterus. ?   Extraocular Movements: Extraocular movements intact.  ?Cardiovascular:  ?   Rate and Rhythm:  Normal rate and regular rhythm.  ?   Pulses: Normal pulses.  ?   Heart sounds: Normal heart sounds. No murmur heard. ?Pulmonary:  ?   Breath sounds: Normal breath sounds. No wheezing or rales.  ?Musculoskeletal:  ?   Cervical back: Neck supple. No tenderness.  ?   Right lower leg: No edema.  ?   Left lower leg: No edema.  ?Skin: ?   General: Skin is warm.  ?   Findings: No rash.  ?Neurological:  ?   General: No focal deficit present.  ?   Mental Status: He is alert and oriented to  person, place, and time.  ?   Sensory: No sensory deficit.  ?   Motor: No weakness.  ?Psychiatric:     ?   Mood and Affect: Mood normal.     ?   Behavior: Behavior normal.  ? ? ?BP 122/62 (BP Location: Left Arm, Patient Position: Sitting, Cuff Size: Normal)   Pulse 64   Resp 18   Ht $R'5\' 9"'jf$  (1.753 m)   Wt 139 lb 12.8 oz (63.4 kg)   SpO2 99%   BMI 20.64 kg/m?  ?Wt Readings from Last 3 Encounters:  ?09/21/21 139 lb 12.8 oz (63.4 kg)  ?08/04/21 140 lb 3.2 oz (63.6 kg)  ?03/20/21 143 lb 0.6 oz (64.9 kg)  ? ? ?Lab Results  ?Component Value Date  ? TSH 0.471 03/20/2021  ? ?Lab Results  ?Component Value Date  ? WBC 6.4 03/20/2021  ? HGB 15.6 03/20/2021  ? HCT 46.0 03/20/2021  ? MCV 97 03/20/2021  ? PLT 180 03/20/2021  ? ?Lab Results  ?Component Value Date  ? NA 135 03/20/2021  ? K 5.1 03/20/2021  ? CO2 21 03/20/2021  ? GLUCOSE 90 03/20/2021  ? BUN 11 03/20/2021  ? CREATININE 0.76 03/20/2021  ? BILITOT 0.6 03/20/2021  ? ALKPHOS 67 03/20/2021  ? AST 29 03/20/2021  ? ALT 27 03/20/2021  ? PROT 7.1 03/20/2021  ? ALBUMIN 4.6 03/20/2021  ? CALCIUM 9.0 03/20/2021  ? ANIONGAP 8 05/12/2016  ? EGFR 95 03/20/2021  ? ?Lab Results  ?Component Value Date  ? CHOL 116 03/20/2021  ? ?Lab Results  ?Component Value Date  ? HDL 50 03/20/2021  ? ?Lab Results  ?Component Value Date  ? LDLCALC 39 03/20/2021  ? ?Lab Results  ?Component Value Date  ? TRIG 163 (H) 03/20/2021  ? ?Lab Results  ?Component Value Date  ? CHOLHDL 3.5 12/18/2014  ? ?Lab Results  ?Component  Value Date  ? HGBA1C 5.8 (H) 12/18/2014  ? ? ?  ?Assessment & Plan:  ? ?Problem List Items Addressed This Visit   ? ?  ? Cardiovascular and Mediastinum  ? Hypertension - Primary  ?  BP Readings from Last 1 Encounte

## 2021-09-25 DIAGNOSIS — Z125 Encounter for screening for malignant neoplasm of prostate: Secondary | ICD-10-CM | POA: Insufficient documentation

## 2021-09-25 DIAGNOSIS — R5382 Chronic fatigue, unspecified: Secondary | ICD-10-CM | POA: Insufficient documentation

## 2021-09-25 DIAGNOSIS — I2510 Atherosclerotic heart disease of native coronary artery without angina pectoris: Secondary | ICD-10-CM | POA: Insufficient documentation

## 2021-09-25 NOTE — Assessment & Plan Note (Signed)
Ordered PSA after discussing its limitations for prostate cancer screening, including false positive results leading additional investigations. 

## 2021-09-25 NOTE — Assessment & Plan Note (Signed)
Smokes about 0.5 pack/day  Asked about quitting: confirms that he/she currently smokes cigarettes Advise to quit smoking: Educated about QUITTING to reduce the risk of cancer, cardio and cerebrovascular disease. Assess willingness: Unwilling to quit at this time, but is working on cutting back. Assist with counseling and pharmacotherapy: Counseled for 5 minutes and literature provided. Arrange for follow up: follow up in 3 months and continue to offer help. 

## 2021-09-25 NOTE — Assessment & Plan Note (Addendum)
S/p stent placement in 2010 Denies any chest pain or dyspnea currently On aspirin and statin Followed by Cardiology 

## 2021-09-25 NOTE — Assessment & Plan Note (Signed)
On Lipitor Check lipid profile 

## 2021-09-25 NOTE — Assessment & Plan Note (Signed)
BP Readings from Last 1 Encounters:  ?09/21/21 122/62  ? ?Well-controlled ?Counseled for compliance with the medications ?Advised DASH diet and moderate exercise/walking as tolerated ?

## 2021-09-25 NOTE — Assessment & Plan Note (Signed)
Unclear etiology ?Check TSH, CBC and CMP ?

## 2021-09-28 ENCOUNTER — Encounter: Payer: Self-pay | Admitting: Internal Medicine

## 2021-09-28 ENCOUNTER — Ambulatory Visit: Payer: Medicare HMO | Admitting: Cardiology

## 2021-09-28 NOTE — Progress Notes (Deleted)
? ? ? ?Clinical Summary ?[name] is a 76 y.o.male seen today for follow up of the following medical problems.  ?  ?1. CAD ?- prior inferior MI in 2008, received BMS to RCA ?- NSTEMI 07/2008 due to RCA ISR, treated with cutting balloon and additional DES. LVEF 60% by LV gram at that time. ?  ?- no recent chest pain. No SOB/DOE ?- compliant with meds ?  ?  ?2. HTN ?-compliant with meds ?  ?3. Hyperlipidemia ?-rcompliant with statin ?- has not gone for labs that were ordered at last visit ?  ?03/2021 TC 116 TG 163 HDL 50 LDL 39 ?  ?  ?4.Preop evaluation ?- needs dental surgery, multiple extractions.  ?- ok from cardiac standpoint, low risk procedure.  ?- no antibiotic needed ?  ?  ?  ?  ?  ?  ?  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  ? ? ?Past Medical History:  ?Diagnosis Date  ? Arteriosclerotic cardiovascular disease (ASCVD)   ? 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 Arthritis   ? Erectile dysfunction   ? Gout   ? acute gouty arthropathy  ? Hyperlipidemia   ? Hypertension   ? Tobacco abuse   ? 60 pack years continuing at one pack per day   ? ? ? ?No Known Allergies ? ? ?Current Outpatient Medications  ?Medication Sig Dispense Refill  ? aspirin 81 MG tablet Take 81 mg by mouth daily.    ? atorvastatin (LIPITOR) 80 MG tablet TAKE 1 TABLET EVERY DAY WITH BREAKFAST 90 tablet 3  ? cetirizine (ZYRTEC) 10 MG tablet Take 1 tablet (10 mg total) by mouth daily. 30 tablet 11  ? hydrochlorothiazide (HYDRODIURIL) 25 MG tablet TAKE ONE TABLET BY MOUTH ONCE DAILY. 90 tablet 3  ? lisinopril (ZESTRIL) 10 MG tablet TAKE (1) TABLET BY MOUTH ONCE DAILY. 90 tablet 3  ? ?No current facility-administered medications for this visit.  ? ? ? ?Past Surgical History:  ?Procedure Laterality Date   ? CARDIAC CATHETERIZATION    ? 2 stents inserted  ? CATARACT EXTRACTION W/PHACO Left 05/14/2016  ? Procedure: CATARACT EXTRACTION PHACO AND INTRAOCULAR LENS PLACEMENT (IOC);  Surgeon: Fabio PierceJames Wrzosek, MD;  Location: [facility];  Service: Ophthalmology;  Laterality: Left;  CDE: 12.39  ? CATARACT EXTRACTION W/PHACO Right 07/16/2016  ? Procedure: CATARACT EXTRACTION PHACO AND INTRAOCULAR LENS PLACEMENT (IOC);  Surgeon: Fabio PierceJames Wrzosek, MD;  Location: [facility];  Service: Ophthalmology;  Laterality: Right;  CDE: 17.59  ? CIRCUMCISION    ? SKIN LESION EXCISION Left   ? left leg and back.  ? ? ? ?No Known Allergies ? ? ? ?No family history on file. ? ? ?Social History ?[name] reports that he has been smoking cigarettes. He started smoking about 54 years ago. He has a 20.00 pack-year smoking history. He has never used smokeless tobacco. ?[name] reports current alcohol use of about 5.0 standard drinks per week. ? ? ?Review of Systems ?CONSTITUTIONAL: No weight loss, fever, chills, weakness or fatigue.  ?HEENT: Eyes: No visual loss, blurred vision, double vision or yellow sclerae.No hearing loss, sneezing, congestion, runny nose or sore throat.  ?SKIN: No rash or itching.  ?CARDIOVASCULAR:  ?RESPIRATORY: No shortness of breath, cough or sputum.  ?  GASTROINTESTINAL: No anorexia, nausea, vomiting or diarrhea. No abdominal pain or blood.  ?GENITOURINARY: No burning on urination, no polyuria ?NEUROLOGICAL: No headache, dizziness, syncope, paralysis, ataxia, numbness or tingling in the extremities. No change in bowel or bladder control.  ?MUSCULOSKELETAL: No muscle, back pain, joint pain or stiffness.  ?LYMPHATICS: No enlarged nodes. No history of splenectomy.  ?PSYCHIATRIC: No history of depression or anxiety.  ?ENDOCRINOLOGIC: No reports of sweating, cold or heat intolerance. No polyuria or polydipsia.  ?. ? ? ?Physical Examination ?There were no vitals filed for this visit. ?There were no vitals filed for this visit. ? ?Gen: resting  comfortably, no acute distress ?HEENT: no scleral icterus, pupils equal round and reactive, no palptable cervical adenopathy,  ?CV ?Resp: Clear to auscultation bilaterally ?GI: abdomen is soft, non-tender, non-distended, normal bowel sounds, no hepatosplenomegaly ?MSK: extremities are warm, no edema.  ?Skin: warm, no rash ?Neuro:  no focal deficits ?Psych: appropriate affect ? ? ?Diagnostic Studies ? ?07/2008 Cath ?RESULTS: Aortic pressure was 115/54 with mean of 85. Left index   ?pressure was 115/10.   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   ?Left anterior descending artery: The left anterior descending artery   ?gave rise to a large diagonal Dionisio Aragones, septal perforator, and 2 small   ?diagonal branches. There was 40% narrowing in the proximal LAD after   ?the diagonal Lailoni Baquera and there was 50% narrowing in the very proximal and   ?ostium of the first diagonal Shaily Librizzi. There was moderate calcification   ?and no irregularities.   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 This was a small vessel that was diffusely   ?diseased with 70-80% stenoses in its proximal to midportion. It filled   ?2 small marginal branches. This vessel was also significantly diseased   ?on the prior study.   ?Right coronary artery: The right coronary artery was a moderately large   ?vessel that gave rise to a posterior descending Ashira Kirsten and a large   ?posterolateral Ayvah Caroll. There were multiple right angle turns in the   ?proximal vessel and there was a long stent in the midvessel which   ?traversed to a right angle bend. There was a 90% focal stenosis just   ?before the second bend and the midvessel. There was also 70-80%   ?narrowing in the distal right coronary artery which extended just to the   ?posterior descending Soniya Ashraf. There was 60% narrowing in the   ?posterolateral Tremel Setters.   ?Left ventriculogram: The  left ventriculogram performed in the [facility]   ?projection showed hypokinesis of the inferobasal segment. The overall   ?wall motion was good with an estimated ejection fraction of 60%.   ?Following cutting balloon angioplasty, the lesion within the stent in   ?the mid-right coronary stenosis improved from 90% to 10%.   ?Following stenting of the lesion, the distal right coronary stenosis   ?improved from 80% to 0%.Marland Kitchen   ?CONCLUSION:   ?1. 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   ?2. Successful percutaneous coronary intervention of the in-stent   ?restenotic lesion in the mid-right coronary using a cutting balloon   ?angioplasty with improvement in central narrowing from 90% to 10%.   ?3. Successful percutaneous coronary intervention of the lesion in the   ?distal right coronary using a Xience drug-eluting stent with   ?improvement in central narrowing from 80% to 0%.   ?DISPOSITION: The patient returned to the [facility] room for further   ?observation. Recommend Plavix for at least a year.   ?  ?  ?  ?10/19/13 Clinic EKG ?NSR ?  ?10/2013 [facility] [place] ?IMPRESSION: ?No evidence of abdominal aortic aneurysm . ? ? ?Assessment and Plan  ?1. CAD/ASCVD ?- no symptoms, continue current meds ?- EKG shows SR, no ischemic changes ?  ?2. HTN ?- he is at goal, continue current meds ?  ?3. Hyperlipidemia ?-LDL at goal, continue current meds ?  ?4. Preoperative evaluation ?- plans for oral surgery. Low risk procedure, no contraindication from cardiac standpoint. Recommend proceeding. No pre antiobiotics are  indicated. Continue aspirin, if absolutely to hold can hold 7 days prior resume day after ? ? ? ? ? ?Antoine Poche, M.D., F.A.C.C. ?

## 2021-11-06 ENCOUNTER — Other Ambulatory Visit: Payer: Self-pay | Admitting: Cardiology

## 2021-11-27 ENCOUNTER — Other Ambulatory Visit: Payer: Self-pay | Admitting: Cardiology

## 2022-02-08 ENCOUNTER — Telehealth: Payer: Self-pay | Admitting: Internal Medicine

## 2022-02-08 NOTE — Telephone Encounter (Signed)
Handicap Placard  Noted  Copied Sleeved    Original in provider box

## 2022-03-24 ENCOUNTER — Ambulatory Visit (INDEPENDENT_AMBULATORY_CARE_PROVIDER_SITE_OTHER): Payer: Medicare HMO | Admitting: Internal Medicine

## 2022-03-24 ENCOUNTER — Encounter: Payer: Self-pay | Admitting: Internal Medicine

## 2022-03-24 VITALS — BP 118/64 | HR 73 | Resp 18 | Ht 69.0 in | Wt 139.4 lb

## 2022-03-24 DIAGNOSIS — Z122 Encounter for screening for malignant neoplasm of respiratory organs: Secondary | ICD-10-CM

## 2022-03-24 DIAGNOSIS — I10 Essential (primary) hypertension: Secondary | ICD-10-CM | POA: Diagnosis not present

## 2022-03-24 DIAGNOSIS — Z23 Encounter for immunization: Secondary | ICD-10-CM

## 2022-03-24 DIAGNOSIS — Z9109 Other allergy status, other than to drugs and biological substances: Secondary | ICD-10-CM

## 2022-03-24 DIAGNOSIS — E782 Mixed hyperlipidemia: Secondary | ICD-10-CM

## 2022-03-24 DIAGNOSIS — Z125 Encounter for screening for malignant neoplasm of prostate: Secondary | ICD-10-CM

## 2022-03-24 DIAGNOSIS — E559 Vitamin D deficiency, unspecified: Secondary | ICD-10-CM

## 2022-03-24 DIAGNOSIS — R7303 Prediabetes: Secondary | ICD-10-CM

## 2022-03-24 DIAGNOSIS — Z0001 Encounter for general adult medical examination with abnormal findings: Secondary | ICD-10-CM | POA: Diagnosis not present

## 2022-03-24 DIAGNOSIS — I2510 Atherosclerotic heart disease of native coronary artery without angina pectoris: Secondary | ICD-10-CM | POA: Diagnosis not present

## 2022-03-24 MED ORDER — FLUTICASONE PROPIONATE 50 MCG/ACT NA SUSP: 2.0000 | Freq: Every day | NASAL | 6 refills | Status: DC

## 2022-03-24 MED ORDER — DESLORATADINE 5 MG PO TABS: 5.0000 mg | ORAL_TABLET | Freq: Every day | ORAL | 5 refills | Status: DC

## 2022-03-24 NOTE — Assessment & Plan Note (Signed)
On Lipitor Check lipid profile 

## 2022-03-24 NOTE — Progress Notes (Signed)
Established Patient Office Visit  Subjective:  Patient ID: [name], male    DOB: 11/20/1945  Age: 76 y.o. MRN: [id number]  CC:  Chief Complaint  Patient presents with   Annual Exam    Annual exam     HPI [name] is a 76 y.o. male with past medical history of CAD, HTN, HLD and tobacco abuse who presents for annual physical.  HTN and CAD: BP is well-controlled. Takes medications regularly. Patient denies headache, dizziness, chest pain, dyspnea or palpitations.  He complains of chronic nasal congestion and postnasal drip due to environmental allergies.  He has tried Zyrtec without much relief.  Denies any fever or chills recently.  Of note, he continues to smoke 0.5 pack/day.  Denies any dyspnea or wheezing currently.  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    Arthritis    Erectile dysfunction    Gout    acute gouty arthropathy   Hyperlipidemia    Hypertension    Tobacco abuse    60 pack years continuing at one pack per day     Past Surgical History:  Procedure Laterality Date   CARDIAC CATHETERIZATION     2 stents inserted   CATARACT EXTRACTION W/PHACO Left 05/14/2016   Procedure: CATARACT EXTRACTION PHACO AND INTRAOCULAR LENS PLACEMENT ([place]);  Surgeon: Baruch Goldmann, MD;  Location: [facility];  Service: Ophthalmology;  Laterality: Left;  CDE: 12.39   CATARACT EXTRACTION W/PHACO Right 07/16/2016   Procedure: CATARACT EXTRACTION PHACO AND INTRAOCULAR LENS PLACEMENT ([place]);  Surgeon: Baruch Goldmann, MD;  Location: [facility];  Service: Ophthalmology;  Laterality: Right;  CDE: 17.59   CIRCUMCISION     SKIN LESION EXCISION Left    left leg and back.    History reviewed.  No pertinent family history.  Social History   Socioeconomic History   Marital status: Widowed    Spouse name: Not on file   Number of children: 2   Years of education: Not on file   Highest education level: Not on file  Occupational History   Occupation: Retired (5 years as of 02/18/21)-    Comment: worked at [employer] in [place] Use   Smoking status: Every Day    Packs/day: 0.50    Years: 40.00    Total pack years: 20.00    Types: Cigarettes    Start date: 11/12/1966   Smokeless tobacco: Never  Vaping Use   Vaping Use: Never used  Substance and Sexual Activity   Alcohol use: Yes    Alcohol/week: 5.0 standard drinks of alcohol    Types: 5 Cans of beer per week   Drug use: No   Sexual activity: Not Currently    Birth control/protection: None  Other Topics Concern   Not on file  Social History Narrative   Not on file   Social Determinants of Health   Financial Resource Strain: Low Risk  (04/16/2021)   Overall Financial Resource Strain (CARDIA)    Difficulty of Paying Living Expenses: Not hard at all  Food Insecurity: No Food Insecurity (04/16/2021)   Hunger Vital Sign    Worried About Running Out of Food in the Last Year: Never true  Ran Out of Food in the Last Year: Never true  Transportation Needs: No Transportation Needs (04/16/2021)   PRAPARE - Hydrologist (Medical): No    Lack of Transportation (Non-Medical): No  Physical Activity: Sufficiently Active (04/16/2021)   Exercise Vital Sign    Days of Exercise per Week: 6 days    Minutes of Exercise per Session: 60 min  Stress: No Stress Concern Present (04/16/2021)   [place]    Feeling of Stress : Not at all  Social Connections: Socially Isolated (04/16/2021)   Social Connection and Isolation Panel [NHANES]    Frequency of Communication with Friends and Family: Twice a week    Frequency of Social  Gatherings with Friends and Family: Never    Attends Religious Services: Never    Marine scientist or Organizations: No    Attends Archivist Meetings: Never    Marital Status: Widowed  Intimate Partner Violence: Not At Risk (04/16/2021)   Humiliation, Afraid, Rape, and Kick questionnaire    Fear of Current or Ex-Partner: No    Emotionally Abused: No    Physically Abused: No    Sexually Abused: No    Outpatient Medications Prior to Visit  Medication Sig Dispense Refill   aspirin 81 MG tablet Take 81 mg by mouth daily.     atorvastatin (LIPITOR) 80 MG tablet TAKE 1 TABLET EVERY DAY WITH BREAKFAST 90 tablet 3   hydrochlorothiazide (HYDRODIURIL) 25 MG tablet TAKE 1 TABLET EVERY DAY 90 tablet 3   lisinopril (ZESTRIL) 10 MG tablet TAKE 1 TABLET EVERY DAY 90 tablet 3   cetirizine (ZYRTEC) 10 MG tablet Take 1 tablet (10 mg total) by mouth daily. 30 tablet 11   No facility-administered medications prior to visit.    No Known Allergies  ROS Review of Systems  Constitutional:  Positive for fatigue. Negative for chills and fever.  HENT:  Positive for congestion and postnasal drip. Negative for sinus pressure, sinus pain and sore throat.   Eyes:  Negative for pain and discharge.  Respiratory:  Negative for cough and shortness of breath.   Cardiovascular:  Negative for chest pain and palpitations.  Gastrointestinal:  Negative for diarrhea, nausea and vomiting.  Genitourinary:  Negative for dysuria and hematuria.  Musculoskeletal:  Negative for neck pain and neck stiffness.  Skin:  Negative for rash.  Neurological:  Negative for dizziness and weakness.  Psychiatric/Behavioral:  Negative for agitation and behavioral problems.       Objective:    Physical Exam Vitals reviewed.  Constitutional:      General: He is not in acute distress.    Appearance: He is not diaphoretic.  HENT:     Head: Normocephalic and atraumatic.     Nose: Nose normal.     Mouth/Throat:      Mouth: Mucous membranes are moist.  Eyes:     General: No scleral icterus.    Extraocular Movements: Extraocular movements intact.  Cardiovascular:     Rate and Rhythm: Normal rate and regular rhythm.     Pulses: Normal pulses.     Heart sounds: Normal heart sounds. No murmur heard. Pulmonary:     Breath sounds: Normal breath sounds. No wheezing or rales.  Abdominal:     Palpations: Abdomen is soft.     Tenderness: There is no abdominal tenderness.  Musculoskeletal:     Cervical back: Neck supple. No tenderness.  Right lower leg: No edema.     Left lower leg: No edema.  Skin:    General: Skin is warm.     Findings: No rash.  Neurological:     General: No focal deficit present.     Mental Status: He is alert and oriented to person, place, and time.     Cranial Nerves: No cranial nerve deficit.     Sensory: No sensory deficit.     Motor: No weakness.  Psychiatric:        Mood and Affect: Mood normal.        Behavior: Behavior normal.     BP 118/64 (BP Location: Right Arm, Patient Position: Sitting, Cuff Size: Normal)   Pulse 73   Resp 18   Ht _0  (1.753 m)   Wt 139 lb 6.4 oz (63.2 kg)   SpO2 99%   BMI 20.59 kg/m  Wt Readings from Last 3 Encounters:  03/24/22 139 lb 6.4 oz (63.2 kg)  09/21/21 139 lb 12.8 oz (63.4 kg)  08/04/21 140 lb 3.2 oz (63.6 kg)    Lab Results  Component Value Date   TSH 0.471 03/20/2021   Lab Results  Component Value Date   WBC 6.4 03/20/2021   HGB 15.6 03/20/2021   HCT 46.0 03/20/2021   MCV 97 03/20/2021   PLT 180 03/20/2021   Lab Results  Component Value Date   NA 135 03/20/2021   K 5.1 03/20/2021   CO2 21 03/20/2021   GLUCOSE 90 03/20/2021   BUN 11 03/20/2021   CREATININE 0.76 03/20/2021   BILITOT 0.6 03/20/2021   ALKPHOS 67 03/20/2021   AST 29 03/20/2021   ALT 27 03/20/2021   PROT 7.1 03/20/2021   ALBUMIN 4.6 03/20/2021   CALCIUM 9.0 03/20/2021   ANIONGAP 8 05/12/2016   EGFR 95 03/20/2021   Lab Results   Component Value Date   CHOL 116 03/20/2021   Lab Results  Component Value Date   HDL 50 03/20/2021   Lab Results  Component Value Date   LDLCALC 39 03/20/2021   Lab Results  Component Value Date   TRIG 163 (H) 03/20/2021   Lab Results  Component Value Date   CHOLHDL 3.5 12/18/2014   Lab Results  Component Value Date   HGBA1C 5.8 (H) 12/18/2014      Assessment & Plan:   Problem List Items Addressed This Visit       Cardiovascular and Mediastinum   Hypertension    BP Readings from Last 1 Encounters:  03/24/22 118/64  Well-controlled with Lisinopril and HCTZ Counseled for compliance with the medications Advised DASH diet and moderate exercise/walking as tolerated      Relevant Orders   TSH   CMP14+EGFR   CBC with Differential/Platelet   Coronary artery disease involving native coronary artery of native heart without angina pectoris    S/p stent placement in 2010 Denies any chest pain or dyspnea currently On aspirin and statin Followed by Cardiology      Relevant Orders   CMP14+EGFR     Other   Hyperlipidemia    On Lipitor Check lipid profile      Relevant Orders   Lipid Profile   Environmental allergies    Uncontrolled with Zyrtec Switched to Clarinex Flonase for nasal congestion      Relevant Medications   desloratadine (CLARINEX) 5 MG tablet   fluticasone (FLONASE) 50 MCG/ACT nasal spray   Prostate cancer screening    Ordered PSA after discussing  its limitations for prostate cancer screening, including false positive results leading to additional investigations.      Relevant Orders   PSA   Encounter for general adult medical examination with abnormal findings - Primary    Physical exam as documented. Fasting blood tests today. Flu vaccine today. Advised to get Shingrix and Tdap vaccines at local pharmacy.      Relevant Orders   TSH   CMP14+EGFR   CBC with Differential/Platelet   Other Visit Diagnoses     Need for immunization  against influenza       Relevant Orders   Flu Vaccine QUAD High Dose(Fluad) (Completed)   Prediabetes       Relevant Orders   Hemoglobin A1c   Vitamin D deficiency       Relevant Orders   VITAMIN D 25 Hydroxy (Vit-D Deficiency, Fractures)   Screening for lung cancer       Relevant Orders   CT CHEST LUNG CANCER SCREENING LOW DOSE WO CONTRAST       Meds ordered this encounter  Medications   desloratadine (CLARINEX) 5 MG tablet    Sig: Take 1 tablet (5 mg total) by mouth daily.    Dispense:  30 tablet    Refill:  5   fluticasone (FLONASE) 50 MCG/ACT nasal spray    Sig: Place 2 sprays into both nostrils daily.    Dispense:  16 g    Refill:  6    Follow-up: Return in about 6 months (around 09/23/2022) for HTN and CAD.    Lindell Spar, MD

## 2022-03-24 NOTE — Assessment & Plan Note (Signed)
Physical exam as documented. Fasting blood tests today. Flu vaccine today. Advised to get Shingrix and Tdap vaccines at local pharmacy.

## 2022-03-24 NOTE — Assessment & Plan Note (Signed)
BP Readings from Last 1 Encounters:  03/24/22 118/64   Well-controlled with Lisinopril and HCTZ Counseled for compliance with the medications Advised DASH diet and moderate exercise/walking as tolerated

## 2022-03-24 NOTE — Assessment & Plan Note (Signed)
S/p stent placement in 2010 Denies any chest pain or dyspnea currently On aspirin and statin Followed by Cardiology

## 2022-03-24 NOTE — Assessment & Plan Note (Addendum)
Ordered PSA after discussing its limitations for prostate cancer screening, including false positive results leading to additional investigations. 

## 2022-03-24 NOTE — Assessment & Plan Note (Signed)
Uncontrolled with Zyrtec Switched to Clarinex Flonase for nasal congestion

## 2022-03-24 NOTE — Patient Instructions (Signed)
Please start taking Clarinex for allergies. Please use Flonase for nasal congestion.  Please continue taking other medications as prescribed.  Please continue to follow low salt diet and ambulate as tolerated.  Please consider getting Shingrix and Tdap vaccines at local pharmacy.

## 2022-03-25 LAB — VITAMIN D 25 HYDROXY (VIT D DEFICIENCY, FRACTURES): Vit D, 25-Hydroxy: 35.5 ng/mL (ref 30.0–100.0)

## 2022-03-25 LAB — LIPID PANEL
Chol/HDL Ratio: 2.3 ratio (ref 0.0–5.0)
Cholesterol, Total: 122 mg/dL (ref 100–199)
HDL: 52 mg/dL (ref >39)
LDL Chol Calc (NIH): 45 mg/dL (ref 0–99)
Triglycerides: 145 mg/dL (ref 0–149)
VLDL Cholesterol Cal: 25 mg/dL (ref 5–40)

## 2022-03-25 LAB — CMP14+EGFR
ALT: 17 IU/L (ref 0–44)
AST: 17 IU/L (ref 0–40)
Albumin/Globulin Ratio: 1.8 (ref 1.2–2.2)
Albumin: 4.4 g/dL (ref 3.8–4.8)
Alkaline Phosphatase: 62 IU/L (ref 44–121)
BUN/Creatinine Ratio: 14 (ref 10–24)
BUN: 11 mg/dL (ref 8–27)
Bilirubin Total: 0.4 mg/dL (ref 0.0–1.2)
CO2: 22 mmol/L (ref 20–29)
Calcium: 9.4 mg/dL (ref 8.6–10.2)
Chloride: 96 mmol/L (ref 96–106)
Creatinine, Ser: 0.77 mg/dL (ref 0.76–1.27)
Globulin, Total: 2.4 g/dL (ref 1.5–4.5)
Glucose: 93 mg/dL (ref 70–99)
Potassium: 5.1 mmol/L (ref 3.5–5.2)
Sodium: 133 mmol/L — ABNORMAL LOW (ref 134–144)
Total Protein: 6.8 g/dL (ref 6.0–8.5)
eGFR: 94 mL/min/{1.73_m2} (ref >59)

## 2022-03-25 LAB — CBC WITH DIFFERENTIAL/PLATELET
Basophils Absolute: 0.1 10*3/uL (ref 0.0–0.2)
Basos: 1 %
EOS (ABSOLUTE): 0.3 10*3/uL (ref 0.0–0.4)
Eos: 4 %
Hematocrit: 43.7 % (ref 37.5–51.0)
Hemoglobin: 14.4 g/dL (ref 13.0–17.7)
Immature Grans (Abs): 0 10*3/uL (ref 0.0–0.1)
Immature Granulocytes: 0 %
Lymphocytes Absolute: 1.7 10*3/uL (ref 0.7–3.1)
Lymphs: 26 %
MCH: 31.3 pg (ref 26.6–33.0)
MCHC: 33 g/dL (ref 31.5–35.7)
MCV: 95 fL (ref 79–97)
Monocytes Absolute: 0.6 10*3/uL (ref 0.1–0.9)
Monocytes: 9 %
Neutrophils Absolute: 4 10*3/uL (ref 1.4–7.0)
Neutrophils: 60 %
Platelets: 246 10*3/uL (ref 150–450)
RBC: 4.6 x10E6/uL (ref 4.14–5.80)
RDW: 12.9 % (ref 11.6–15.4)
WBC: 6.7 10*3/uL (ref 3.4–10.8)

## 2022-03-25 LAB — HEMOGLOBIN A1C
Est. average glucose Bld gHb Est-mCnc: 111 mg/dL
Hgb A1c MFr Bld: 5.5 % (ref 4.8–5.6)

## 2022-03-25 LAB — PSA: Prostate Specific Ag, Serum: 0.9 ng/mL (ref 0.0–4.0)

## 2022-03-25 LAB — TSH: TSH: 0.648 u[IU]/mL (ref 0.450–4.500)

## 2022-04-16 ENCOUNTER — Ambulatory Visit (HOSPITAL_COMMUNITY): Payer: Medicare HMO

## 2022-04-20 ENCOUNTER — Encounter: Payer: Medicare HMO | Admitting: Internal Medicine

## 2022-04-27 ENCOUNTER — Other Ambulatory Visit: Payer: Self-pay | Admitting: Cardiology

## 2022-04-27 ENCOUNTER — Ambulatory Visit (INDEPENDENT_AMBULATORY_CARE_PROVIDER_SITE_OTHER): Payer: Medicare HMO

## 2022-04-27 DIAGNOSIS — Z0000 Encounter for general adult medical examination without abnormal findings: Secondary | ICD-10-CM

## 2022-04-27 NOTE — Progress Notes (Signed)
Subjective:   [name] is a 76 y.o. male who presents for Medicare Annual/Subsequent preventive examination. I connected with  [name] on 04/27/22 by a audio enabled telemedicine application and verified that I am speaking with the correct person using two identifiers.  Patient Location: Home  Provider Location: Office/Clinic  I discussed the limitations of evaluation and management by telemedicine. The patient expressed understanding and agreed to proceed.  Review of Systems           Objective:    There were no vitals filed for this visit. There is no height or weight on file to calculate BMI.     04/16/2021   10:14 AM 07/16/2016   10:45 AM 05/14/2016    6:29 AM 05/12/2016    8:56 AM  Advanced Directives  Does Patient Have a Medical Advance Directive? No No No No  Would patient like information on creating a medical advance directive? No - Patient declined No - Patient declined No - Patient declined No - Patient declined    Current Medications (verified) Outpatient Encounter Medications as of 04/27/2022  Medication Sig   aspirin 81 MG tablet Take 81 mg by mouth daily.   atorvastatin (LIPITOR) 80 MG tablet TAKE 1 TABLET EVERY DAY WITH BREAKFAST   desloratadine (CLARINEX) 5 MG tablet Take 1 tablet (5 mg total) by mouth daily.   fluticasone (FLONASE) 50 MCG/ACT nasal spray Place 2 sprays into both nostrils daily.   hydrochlorothiazide (HYDRODIURIL) 25 MG tablet TAKE 1 TABLET EVERY DAY   lisinopril (ZESTRIL) 10 MG tablet TAKE 1 TABLET EVERY DAY   No facility-administered encounter medications on file as of 04/27/2022.    Allergies (verified) Patient has no known allergies.   History: Past Medical History:  Diagnosis Date   Arteriosclerotic cardiovascular disease (ASCVD)    IMI in 2008 required BMS to the RCA ;non-Q MYOCARDIAL INFARCTION IN 07/2008 40-50% lad ;rca RESTENOSIS TREATED  with cutting balloon  plus new distal disease requiring DES;cx-anomalous  origin small vessel with diffuse disease including an 80% proximal lesion    Arthritis    Erectile dysfunction    Gout    acute gouty arthropathy   Hyperlipidemia    Hypertension    Tobacco abuse    60 pack years continuing at one pack per day    Past Surgical History:  Procedure Laterality Date   CARDIAC CATHETERIZATION     2 stents inserted   CATARACT EXTRACTION W/PHACO Left 05/14/2016   Procedure: CATARACT EXTRACTION PHACO AND INTRAOCULAR LENS PLACEMENT ([facility]);  Surgeon: Fabio Pierce, MD;  Location: [facility];  Service: Ophthalmology;  Laterality: Left;  CDE: 12.39   CATARACT EXTRACTION W/PHACO Right 07/16/2016   Procedure: CATARACT EXTRACTION PHACO AND INTRAOCULAR LENS PLACEMENT ([facility]);  Surgeon: Fabio Pierce, MD;  Location: [facility];  Service: Ophthalmology;  Laterality: Right;  CDE: 17.59   CIRCUMCISION     SKIN LESION EXCISION Left    left leg and back.   No family history on file. Social History   Socioeconomic History   Marital status: Widowed    Spouse name: Not on file   Number of children: 2   Years of education: Not on file   Highest education level: Not on file  Occupational History   Occupation: Retired (5 years as of 02/18/21)-    Comment: worked at [employer] in [employer]  Tobacco Use   Smoking status: Every Day    Packs/day: 0.50    Years: 40.00  Total pack years: 20.00    Types: Cigarettes    Start date: 11/12/1966   Smokeless tobacco: Never  Vaping Use   Vaping Use: Never used  Substance and Sexual Activity   Alcohol use: Yes    Alcohol/week: 5.0 standard drinks of alcohol    Types: 5 Cans of beer per week   Drug use: No   Sexual activity: Not Currently    Birth control/protection: None  Other Topics Concern   Not on file  Social History Narrative   Not on file   Social Determinants of Health   Financial Resource Strain: Low Risk  (04/16/2021)   Overall Financial Resource Strain (CARDIA)    Difficulty of Paying Living Expenses: Not hard at  all  Food Insecurity: No Food Insecurity (04/16/2021)   Hunger Vital Sign    Worried About Running Out of Food in the Last Year: Never true    Ran Out of Food in the Last Year: Never true  Transportation Needs: No Transportation Needs (04/16/2021)   PRAPARE - Administrator, Civil ServiceTransportation    Lack of Transportation (Medical): No    Lack of Transportation (Non-Medical): No  Physical Activity: Sufficiently Active (04/16/2021)   Exercise Vital Sign    Days of Exercise per Week: 6 days    Minutes of Exercise per Session: 60 min  Stress: No Stress Concern Present (04/16/2021)   [employer] - Occupational Stress Questionnaire    Feeling of Stress : Not at all  Social Connections: Socially Isolated (04/16/2021)   Social Connection and Isolation Panel [NHANES]    Frequency of Communication with Friends and Family: Twice a week    Frequency of Social Gatherings with Friends and Family: Never    Attends Religious Services: Never    Database administratorActive Member of Clubs or Organizations: No    Attends BankerClub or Organization Meetings: Never    Marital Status: Widowed    Tobacco Counseling Ready to quit: Not Answered Counseling given: Not Answered   Clinical Intake:                 Diabetic?no         Activities of Daily Living     No data to display          Patient Care Team: Anabel HalonPatel, Rutwik K, MD as PCP - General (Internal Medicine) Wyline MoodBranch, Dorothe PeaJonathan F, MD as PCP - Cardiology (Cardiology) Wyline MoodBranch, Dorothe PeaJonathan F, MD as Consulting Physician (Cardiology)  Indicate any recent Medical Services you may have received from other than [facility] providers in the past year (date may be approximate).     Assessment:   This is a routine wellness examination for [name].  Hearing/Vision screen No results found.  Dietary issues and exercise activities discussed:     Goals Addressed   None    Depression Screen    03/24/2022    8:54 AM 09/21/2021    9:38 AM 04/16/2021   10:14 AM  04/16/2021   10:11 AM 03/20/2021    8:40 AM 02/18/2021   10:08 AM  PHQ 2/9 Scores  PHQ - 2 Score 0 0 0 0 0 0    Fall Risk    03/24/2022    8:54 AM 09/21/2021    9:38 AM 04/16/2021   10:14 AM 03/20/2021    8:40 AM 02/18/2021   10:07 AM  Fall Risk   Falls in the past year? 0 0 0 0 0  Number falls in past yr: 0 0 0 0 0  Injury with Fall? 0 0 0 0 0  Risk for fall due to : No Fall Risks No Fall Risks No Fall Risks No Fall Risks No Fall Risks  Follow up Falls evaluation completed Falls evaluation completed Falls evaluation completed Falls evaluation completed Falls evaluation completed    FALL RISK PREVENTION PERTAINING TO THE HOME:  Any stairs in or around the home? No  If so, are there any without handrails? No  Home free of loose throw rugs in walkways, pet beds, electrical cords, etc? Yes  Adequate lighting in your home to reduce risk of falls? Yes   ASSISTIVE DEVICES UTILIZED TO PREVENT FALLS:  Life alert? No  Use of a cane, walker or w/c? No  Grab bars in the bathroom? Yes  Shower chair or bench in shower? Yes  Elevated toilet seat or a handicapped toilet? No   TIMED UP AND GO:  Was the test performed? No .  Length of time to ambulate 10 feet:  sec.     Cognitive Function:    04/16/2021   10:16 AM  MMSE - Mini Mental State Exam  Not completed: Unable to complete        04/16/2021   10:16 AM  6CIT Screen  What Year? 0 points  What month? 0 points  What time? 0 points  Count back from 20 0 points  Months in reverse 4 points  Repeat phrase 0 points  Total Score 4 points    Immunizations Immunization History  Administered Date(s) Administered   Fluad Quad(high Dose 65+) 03/20/2021, 03/24/2022   Janssen (J&J) SARS-COV-2 Vaccination 11/25/2019, 05/01/2020   PNEUMOCOCCAL CONJUGATE-20 09/21/2021    TDAP status: Due, Education has been provided regarding the importance of this vaccine. Advised may receive this vaccine at local pharmacy or Health Dept. Aware to  provide a copy of the vaccination record if obtained from local pharmacy or Health Dept. Verbalized acceptance and understanding.  Flu Vaccine status: Up to date  Pneumococcal vaccine status: Up to date  Covid-19 vaccine status: Information provided on how to obtain vaccines.   Qualifies for Shingles Vaccine? Yes   Zostavax completed No   Shingrix Completed?: No.    Education has been provided regarding the importance of this vaccine. Patient has been advised to call insurance company to determine out of pocket expense if they have not yet received this vaccine. Advised may also receive vaccine at local pharmacy or Health Dept. Verbalized acceptance and understanding.  Screening Tests Health Maintenance  Topic Date Due   TETANUS/TDAP  Never done   Zoster Vaccines- Shingrix (1 of 2) Never done   Lung Cancer Screening  Never done   COVID-19 Vaccine (3 - Janssen risk series) 06/26/2020   Medicare Annual Wellness (AWV)  04/16/2022   COLONOSCOPY (Pts 45-29yrs Insurance coverage will need to be confirmed)  09/22/2022 (Originally 06/02/1992)   Pneumonia Vaccine 50+ Years old  Completed   INFLUENZA VACCINE  Completed   Hepatitis C Screening  Completed   HPV VACCINES  Aged Out    Health Maintenance  Health Maintenance Due  Topic Date Due   TETANUS/TDAP  Never done   Zoster Vaccines- Shingrix (1 of 2) Never done   Lung Cancer Screening  Never done   COVID-19 Vaccine (3 - Janssen risk series) 06/26/2020   Medicare Annual Wellness (AWV)  04/16/2022    Colorectal cancer screening: No longer required.   Lung Cancer Screening: (Low Dose CT Chest recommended if Age 31-80 years, 30 pack-year currently  smoking OR have quit w/in 15years.) does qualify.   Lung Cancer Screening Referral: pt wants to hold off  Additional Screening:  Hepatitis C Screening: does not qualify; Completed 03/20/21  Vision Screening: Recommended annual ophthalmology exams for early detection of glaucoma and other  disorders of the eye. Is the patient up to date with their annual eye exam?  Yes  Who is the provider or what is the name of the office in which the patient attends annual eye exams?  [facility] If pt is not established with a provider, would they like to be referred to a provider to establish care? No .   Dental Screening: Recommended annual dental exams for proper oral hygiene  Community Resource Referral / Chronic Care Management: CRR required this visit?  No   CCM required this visit?  No      Plan:     I have personally reviewed and noted the following in the patient's chart:   Medical and social history Use of alcohol, tobacco or illicit drugs  Current medications and supplements including opioid prescriptions. Patient is not currently taking opioid prescriptions. Functional ability and status Nutritional status Physical activity Advanced directives List of other physicians Hospitalizations, surgeries, and ER visits in previous 12 months Vitals Screenings to include cognitive, depression, and falls Referrals and appointments  In addition, I have reviewed and discussed with patient certain preventive protocols, quality metrics, and best practice recommendations. A written personalized care plan for preventive services as well as general preventive health recommendations were provided to patient.     Harriet Pho, CMA   04/27/2022   Nurse Notes:

## 2022-04-27 NOTE — Patient Instructions (Signed)
  [name] , Thank you for taking time to come for your Medicare Wellness Visit. I appreciate your ongoing commitment to your health goals. Please review the following plan we discussed and let me know if I can assist you in the future.   These are the goals we discussed:  Goals      Patient Stated     Pt. Does not have any goals at this time     Patient Stated     Patient states that he currently does not have any goals set, if he feels like doing something he will just go ahead and do it.        This is a list of the screening recommended for you and due dates:  Health Maintenance  Topic Date Due   Tetanus Vaccine  Never done   Zoster (Shingles) Vaccine (1 of 2) Never done   Screening for Lung Cancer  Never done   COVID-19 Vaccine (3 - Janssen risk series) 06/26/2020   Colon Cancer Screening  09/22/2022*   Medicare Annual Wellness Visit  04/28/2023   Pneumonia Vaccine  Completed   Flu Shot  Completed   Hepatitis C Screening: USPSTF Recommendation to screen - Ages 18-79 yo.  Completed   HPV Vaccine  Aged Out  *Topic was postponed. The date shown is not the original due date.

## 2022-04-29 ENCOUNTER — Other Ambulatory Visit: Payer: Self-pay

## 2022-04-29 DIAGNOSIS — Z9109 Other allergy status, other than to drugs and biological substances: Secondary | ICD-10-CM

## 2022-04-29 MED ORDER — CETIRIZINE HCL 10 MG PO TABS: 10.0000 mg | ORAL_TABLET | Freq: Every day | ORAL | 11 refills | Status: DC

## 2022-08-12 ENCOUNTER — Telehealth: Payer: Self-pay | Admitting: Cardiology

## 2022-08-12 MED ORDER — LISINOPRIL 10 MG PO TABS: 10.0000 mg | ORAL_TABLET | Freq: Every day | ORAL | 1 refills | Status: DC

## 2022-08-12 NOTE — Telephone Encounter (Signed)
Pt has f/u apt in May with dr.Branch, refilled lisinopril to [facility]

## 2022-08-12 NOTE — Telephone Encounter (Signed)
*  STAT* If patient is at the pharmacy, call can be transferred to refill team.   1. Which medications need to be refilled? (please list name of each medication and dose if known) lisinopril (ZESTRIL) 10 MG tablet   2. Which pharmacy/location (including street and city if local pharmacy) is medication to be sent to? [address]    3. Do they need a 30 day or 90 day supply? [place]

## 2022-08-23 ENCOUNTER — Ambulatory Visit: Payer: Medicare HMO | Attending: Cardiology | Admitting: Cardiology

## 2022-08-23 ENCOUNTER — Encounter: Payer: Self-pay | Admitting: Cardiology

## 2022-08-23 VITALS — BP 122/55 | HR 86 | Ht 67.0 in | Wt 142.2 lb

## 2022-08-23 DIAGNOSIS — E782 Mixed hyperlipidemia: Secondary | ICD-10-CM | POA: Diagnosis not present

## 2022-08-23 DIAGNOSIS — I10 Essential (primary) hypertension: Secondary | ICD-10-CM | POA: Diagnosis not present

## 2022-08-23 DIAGNOSIS — I2510 Atherosclerotic heart disease of native coronary artery without angina pectoris: Secondary | ICD-10-CM | POA: Diagnosis not present

## 2022-08-23 NOTE — Progress Notes (Signed)
Clinical Summary [name] is a 77 y.o.male seen today for follow up of the following medical problems.    1. CAD - prior inferior MI in 2008, received BMS to RCA - NSTEMI 07/2008 due to RCA ISR, treated with cutting balloon and additional DES. LVEF 60% by LV gram at that time.   - no chest pains, no SOB/DOE - he is compliant with meds     2. HTN - he is compliant with meds   3. 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     atorvastatin (LIPITOR) 80 MG tablet TAKE 1 TABLET EVERY DAY WITH BREAKFAST 90 tablet 3   cetirizine (ZYRTEC) 10 MG tablet Take 1 tablet (10 mg total) by mouth daily. 30 tablet 11   desloratadine (CLARINEX) 5 MG tablet Take 1 tablet (5 mg total) by mouth daily. 30 tablet 5   fluticasone (FLONASE) 50 MCG/ACT nasal spray Place 2 sprays into both nostrils daily. 16 g 6   hydrochlorothiazide (HYDRODIURIL) 25 MG tablet TAKE 1 TABLET EVERY DAY 90 tablet 3   lisinopril (ZESTRIL) 10 MG tablet Take 1 tablet (10 mg total) by mouth daily. 90 tablet 1   No current facility-administered medications for  this visit.     Past Surgical History:  Procedure Laterality Date   CARDIAC CATHETERIZATION     2 stents inserted   CATARACT EXTRACTION W/PHACO Left 05/14/2016   Procedure: CATARACT EXTRACTION PHACO AND INTRAOCULAR LENS PLACEMENT ([facility]);  Surgeon: Baruch Goldmann, MD;  Location: [facility];  Service: Ophthalmology;  Laterality: Left;  CDE: 12.39   CATARACT EXTRACTION W/PHACO Right 07/16/2016   Procedure: CATARACT EXTRACTION PHACO AND INTRAOCULAR LENS PLACEMENT ([place]);  Surgeon: Baruch Goldmann, MD;  Location: [facility];  Service: Ophthalmology;  Laterality: Right;  CDE: 17.59   CIRCUMCISION     SKIN LESION EXCISION Left    left leg and back.     No Known Allergies    History reviewed. No pertinent family history.   Social History [name] reports that he has been smoking cigarettes. He started smoking about 55 years ago. He has a 20.00 pack-year smoking history. He has never used smokeless tobacco. [name] reports current alcohol use of about 5.0 standard drinks of alcohol per week.   Review of Systems CONSTITUTIONAL: No weight loss, fever, chills, weakness or fatigue.  HEENT: Eyes: No visual loss, blurred vision, double vision or yellow sclerae.No hearing loss, sneezing, congestion, runny nose or  sore throat.  SKIN: No rash or itching.  CARDIOVASCULAR: per hpi RESPIRATORY: No shortness of breath, cough or sputum.  GASTROINTESTINAL: No anorexia, nausea, vomiting or diarrhea. No abdominal pain or blood.  GENITOURINARY: No burning on urination, no polyuria NEUROLOGICAL: No headache, dizziness, syncope, paralysis, ataxia, numbness or tingling in the extremities. No change in bowel or bladder control.  MUSCULOSKELETAL: No muscle, back pain, joint pain or stiffness.  LYMPHATICS: No enlarged nodes. No history of splenectomy.  PSYCHIATRIC: No history of depression or anxiety.  ENDOCRINOLOGIC: No reports of sweating, cold or heat intolerance. No polyuria or polydipsia.  Marland Kitchen   Physical  Examination Today's Vitals   08/23/22 0917 08/23/22 0938  BP:  (!) 122/55  Pulse: 86   SpO2: 97%   Weight: 142 lb 3.2 oz (64.5 kg)   Height: '5\' 7"'$  (1.702 m)    Body mass index is 22.27 kg/m.  Gen: resting comfortably, no acute distress HEENT: no scleral icterus, pupils equal round and reactive, no palptable cervical adenopathy,  CV: RRR, no m/rg, no jvd Resp: Clear to auscultation bilaterally GI: abdomen is soft, non-tender, non-distended, normal bowel sounds, no hepatosplenomegaly MSK: extremities are warm, no edema.  Skin: warm, no rash Neuro:  no focal deficits Psych: appropriate affect   Diagnostic Studies   07/2008 Cath RESULTS: Aortic pressure was 115/54 with mean of 85. Left index   pressure was 115/10.   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   Left anterior descending artery: The left anterior descending artery   gave rise to a large diagonal [name], septal perforator, and 2 small   diagonal branches. There was 40% narrowing in the proximal LAD after   the diagonal [name] and there was 50% narrowing in the very proximal and   ostium of the first diagonal [name]. There was moderate calcification   and no irregularities.   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 This was a small vessel that was diffusely   diseased with 70-80% stenoses in its proximal to midportion. It filled   2 small marginal branches. This vessel was also significantly diseased   on the prior study.   Right coronary artery: The right coronary artery was a moderately large   vessel that gave rise to a posterior descending [name] and a large   posterolateral [name]. There were multiple right angle turns in the   proximal vessel and there was a long stent in the midvessel which   traversed to a right angle bend. There was a 90% focal stenosis just   before the  second bend and the midvessel. There was also 70-80%   narrowing in the distal right coronary artery which extended just to the   posterior descending [name]. There was 60% narrowing in the   posterolateral [name].   Left ventriculogram: The left ventriculogram performed in the RAO   projection showed hypokinesis of the inferobasal segment. The overall   wall motion was good with an estimated ejection fraction of 60%.   Following cutting balloon angioplasty, the lesion within the stent in   the mid-right coronary stenosis improved from 90% to 10%.   Following stenting of the lesion, the distal right coronary stenosis   improved from 80% to 0%..   CONCLUSION:   1. 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   2. Successful percutaneous coronary intervention of the in-stent   restenotic lesion in the mid-right coronary using a cutting balloon   angioplasty with improvement in central narrowing from 90% to 10%.   3. Successful percutaneous coronary intervention of the lesion in the   distal right coronary using a Xience drug-eluting stent with   improvement in central narrowing from 80% to 0%.   DISPOSITION: The patient returned to the [facility] room for further   observation. Recommend Plavix for at least a year.         10/19/13 Clinic EKG NSR   10/2013 AAA US IMPRESSION: No evidence of abdominal aortic aneurysm .  Assessment and Plan   1. CAD/ASCVD - no symptoms, continue current meds - EKG with SR, no acute ischemic changes   2. HTN - at goal, continue  current meds   3. Hyperlipidemia -at goal, continue current meds        Arnoldo Lenis, M.D.

## 2022-08-23 NOTE — Patient Instructions (Signed)
Medication Instructions:  Continue all current medications.   Labwork: none  Testing/Procedures: none  Follow-Up: 6 months   Any Other Special Instructions Will Be Listed Below (If Applicable).   If you need a refill on your cardiac medications before your next appointment, please call your pharmacy.  

## 2022-09-24 ENCOUNTER — Ambulatory Visit (INDEPENDENT_AMBULATORY_CARE_PROVIDER_SITE_OTHER): Payer: Medicare HMO | Admitting: Internal Medicine

## 2022-09-24 ENCOUNTER — Encounter: Payer: Self-pay | Admitting: Internal Medicine

## 2022-09-24 VITALS — BP 134/66 | HR 73 | Ht 67.0 in | Wt 141.2 lb

## 2022-09-24 DIAGNOSIS — F17210 Nicotine dependence, cigarettes, uncomplicated: Secondary | ICD-10-CM

## 2022-09-24 DIAGNOSIS — Z9109 Other allergy status, other than to drugs and biological substances: Secondary | ICD-10-CM | POA: Diagnosis not present

## 2022-09-24 DIAGNOSIS — R5382 Chronic fatigue, unspecified: Secondary | ICD-10-CM

## 2022-09-24 DIAGNOSIS — Z720 Tobacco use: Secondary | ICD-10-CM

## 2022-09-24 DIAGNOSIS — E782 Mixed hyperlipidemia: Secondary | ICD-10-CM

## 2022-09-24 DIAGNOSIS — I10 Essential (primary) hypertension: Secondary | ICD-10-CM | POA: Diagnosis not present

## 2022-09-24 DIAGNOSIS — I2510 Atherosclerotic heart disease of native coronary artery without angina pectoris: Secondary | ICD-10-CM | POA: Diagnosis not present

## 2022-09-24 MED ORDER — HYDROCHLOROTHIAZIDE 25 MG PO TABS: 25.0000 mg | ORAL_TABLET | Freq: Every day | ORAL | 3 refills | Status: DC

## 2022-09-24 MED ORDER — LISINOPRIL 10 MG PO TABS: 10.0000 mg | ORAL_TABLET | Freq: Every day | ORAL | 3 refills | Status: DC

## 2022-09-24 NOTE — Patient Instructions (Addendum)
Please start taking Mens' multivitamin once daily.  Please continue to take medications as prescribed.  Please continue to follow low salt diet and perform moderate exercise/walking at least 150 mins/week.  Please consider getting Shingrix and Tdap vaccine at local pharmacy.

## 2022-09-24 NOTE — Assessment & Plan Note (Addendum)
Well-controlled with Zyrtec and Clarinex, takes alternatively Flonase for nasal congestion

## 2022-09-24 NOTE — Progress Notes (Signed)
Established Patient Office Visit  Subjective:  Patient ID: [name], male    DOB: January 19, 1946  Age: 77 y.o. MRN: [id number]  CC:  Chief Complaint  Patient presents with   Hypertension    Six month follow up for hypertension and CAD    HPI [name] is a 77 y.o. male with past medical history of CAD, HTN, HLD and tobacco abuse who presents for f/u of his chronic medical conditions.  HTN and CAD: BP is well-controlled. Takes medications regularly. Patient denies headache, dizziness, chest pain, dyspnea or palpitations.  He complains of chronic fatigue, but denies any fever, chills, cough, hemoptysis, night sweats, or LAD.  He smokes about 0.5 pack/day.  He is trying to cut down smoking currently.  Of note, he denies any dyspnea or wheezing currently.   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    Arthritis    Erectile dysfunction    Gout    acute gouty arthropathy   Hyperlipidemia    Hypertension    Tobacco abuse    60 pack years continuing at one pack per day     Past Surgical History:  Procedure Laterality Date   CARDIAC CATHETERIZATION     2 stents inserted   CATARACT EXTRACTION W/PHACO Left 05/14/2016   Procedure: CATARACT EXTRACTION PHACO AND INTRAOCULAR LENS PLACEMENT ([facility]);  Surgeon: Fabio Pierce, MD;  Location: [facility];  Service: Ophthalmology;  Laterality: Left;  CDE: 12.39   CATARACT EXTRACTION W/PHACO Right 07/16/2016   Procedure: CATARACT EXTRACTION PHACO AND INTRAOCULAR LENS PLACEMENT ([facility]);  Surgeon: Fabio Pierce, MD;  Location: [facility];  Service: Ophthalmology;  Laterality: Right;  CDE: 17.59   CIRCUMCISION     SKIN LESION EXCISION Left    left leg  and back.    History reviewed. No pertinent family history.  Social History   Socioeconomic History   Marital status: Widowed    Spouse name: Not on file   Number of children: 2   Years of education: Not on file   Highest education level: Not on file  Occupational History   Occupation: Retired (5 years as of 02/18/21)-    Comment: worked at [employer] in [employer] [place]  Tobacco Use   Smoking status: Every Day    Packs/day: 0.50    Years: 40.00    Additional pack years: 0.00    Total pack years: 20.00    Types: Cigarettes    Start date: 11/12/1966   Smokeless tobacco: Never  Vaping Use   Vaping Use: Never used  Substance and Sexual Activity   Alcohol use: Yes    Alcohol/week: 5.0 standard drinks of alcohol    Types: 5 Cans of beer per week   Drug use: No   Sexual activity: Not Currently    Birth control/protection: None  Other Topics Concern   Not on file  Social History Narrative   Not on file   Social Determinants of Health   Financial Resource Strain: Low Risk  (04/16/2021)   Overall Financial Resource Strain (CARDIA)    Difficulty of Paying Living Expenses: Not hard at all  Food Insecurity: No Food Insecurity (04/27/2022)   Hunger Vital Sign  Worried About Programme researcher, broadcasting/film/video in the Last Year: Never true    Ran Out of Food in the Last Year: Never true  Transportation Needs: No Transportation Needs (04/27/2022)   PRAPARE - Administrator, Civil Service (Medical): No    Lack of Transportation (Non-Medical): No  Physical Activity: Sufficiently Active (04/16/2021)   Exercise Vital Sign    Days of Exercise per Week: 6 days    Minutes of Exercise per Session: 60 min  Stress: No Stress Concern Present (04/16/2021)   [employer] of Occupational Health - Occupational Stress Questionnaire    Feeling of Stress : Not at all  Social Connections: Socially Isolated (04/27/2022)   Social Connection and Isolation Panel [NHANES]    Frequency of Communication  with Friends and Family: Three times a week    Frequency of Social Gatherings with Friends and Family: Once a week    Attends Religious Services: Never    Database administrator or Organizations: No    Attends Banker Meetings: Never    Marital Status: Widowed  Intimate Partner Violence: Not At Risk (04/16/2021)   Humiliation, Afraid, Rape, and Kick questionnaire    Fear of Current or Ex-Partner: No    Emotionally Abused: No    Physically Abused: No    Sexually Abused: No    Outpatient Medications Prior to Visit  Medication Sig Dispense Refill   aspirin 81 MG tablet Take 81 mg by mouth daily.     atorvastatin (LIPITOR) 80 MG tablet TAKE 1 TABLET EVERY DAY WITH BREAKFAST 90 tablet 3   cetirizine (ZYRTEC) 10 MG tablet Take 1 tablet (10 mg total) by mouth daily. 30 tablet 11   desloratadine (CLARINEX) 5 MG tablet Take 1 tablet (5 mg total) by mouth daily. 30 tablet 5   fluticasone (FLONASE) 50 MCG/ACT nasal spray Place 2 sprays into both nostrils daily. 16 g 6   hydrochlorothiazide (HYDRODIURIL) 25 MG tablet TAKE 1 TABLET EVERY DAY 90 tablet 3   lisinopril (ZESTRIL) 10 MG tablet Take 1 tablet (10 mg total) by mouth daily. 90 tablet 1   No facility-administered medications prior to visit.    No Known Allergies  ROS Review of Systems  Constitutional:  Positive for fatigue. Negative for chills and fever.  HENT:  Negative for congestion and sore throat.   Eyes:  Negative for pain and discharge.  Respiratory:  Negative for cough and shortness of breath.   Cardiovascular:  Negative for chest pain and palpitations.  Gastrointestinal:  Negative for constipation, diarrhea, nausea and vomiting.  Endocrine: Negative for polydipsia and polyuria.  Genitourinary:  Negative for dysuria and hematuria.  Musculoskeletal:  Negative for neck pain and neck stiffness.  Skin:  Negative for rash.  Neurological:  Negative for dizziness, weakness, numbness and headaches.   Psychiatric/Behavioral:  Negative for agitation and behavioral problems.       Objective:    Physical Exam Vitals reviewed.  Constitutional:      General: He is not in acute distress.    Appearance: He is not diaphoretic.  HENT:     Head: Normocephalic and atraumatic.     Nose: Nose normal.     Mouth/Throat:     Mouth: Mucous membranes are moist.  Eyes:     General: No scleral icterus.    Extraocular Movements: Extraocular movements intact.  Cardiovascular:     Rate and Rhythm: Normal rate and regular rhythm.     Pulses: Normal pulses.  Heart sounds: Normal heart sounds. No murmur heard. Pulmonary:     Breath sounds: Normal breath sounds. No wheezing or rales.  Musculoskeletal:     Cervical back: Neck supple. No tenderness.     Right lower leg: No edema.     Left lower leg: No edema.  Skin:    General: Skin is warm.     Findings: No rash.  Neurological:     General: No focal deficit present.     Mental Status: He is alert and oriented to person, place, and time.     Sensory: No sensory deficit.     Motor: No weakness.  Psychiatric:        Mood and Affect: Mood normal.        Behavior: Behavior normal.     BP 134/66 (BP Location: Right Arm)   Pulse 73   Ht  (1.702 m)   Wt 141 lb 3.2 oz (64 kg)   SpO2 97%   BMI 22.12 kg/m  Wt Readings from Last 3 Encounters:  09/24/22 141 lb 3.2 oz (64 kg)  08/23/22 142 lb 3.2 oz (64.5 kg)  03/24/22 139 lb 6.4 oz (63.2 kg)    Lab Results  Component Value Date   TSH 0.648 03/24/2022   Lab Results  Component Value Date   WBC 6.7 03/24/2022   HGB 14.4 03/24/2022   HCT 43.7 03/24/2022   MCV 95 03/24/2022   PLT 246 03/24/2022   Lab Results  Component Value Date   NA 133 (L) 03/24/2022   K 5.1 03/24/2022   CO2 22 03/24/2022   GLUCOSE 93 03/24/2022   BUN 11 03/24/2022   CREATININE 0.77 03/24/2022   BILITOT 0.4 03/24/2022   ALKPHOS 62 03/24/2022   AST 17 03/24/2022   ALT 17 03/24/2022   PROT 6.8  03/24/2022   ALBUMIN 4.4 03/24/2022   CALCIUM 9.4 03/24/2022   ANIONGAP 8 05/12/2016   EGFR 94 03/24/2022   Lab Results  Component Value Date   CHOL 122 03/24/2022   Lab Results  Component Value Date   HDL 52 03/24/2022   Lab Results  Component Value Date   LDLCALC 45 03/24/2022   Lab Results  Component Value Date   TRIG 145 03/24/2022   Lab Results  Component Value Date   CHOLHDL 2.3 03/24/2022   Lab Results  Component Value Date   HGBA1C 5.5 03/24/2022      Assessment & Plan:   Problem List Items Addressed This Visit       Cardiovascular and Mediastinum   Hypertension - Primary    BP Readings from Last 1 Encounters:  09/24/22 134/66  Well-controlled with Lisinopril and HCTZ Counseled for compliance with the medications Advised DASH diet and moderate exercise/walking as tolerated      Relevant Medications   lisinopril (ZESTRIL) 10 MG tablet   hydrochlorothiazide (HYDRODIURIL) 25 MG tablet   Coronary artery disease involving native coronary artery of native heart without angina pectoris    S/p stent placement in 2010 Denies any chest pain or dyspnea currently On aspirin and statin Followed by Cardiology      Relevant Medications   lisinopril (ZESTRIL) 10 MG tablet   hydrochlorothiazide (HYDRODIURIL) 25 MG tablet     Other   Tobacco abuse    Smokes about 0.5 pack/day  Asked about quitting: confirms that he/she currently smokes cigarettes Advise to quit smoking: Educated about QUITTING to reduce the risk of cancer, cardio and cerebrovascular disease. Assess willingness:  Unwilling to quit at this time, but is working on cutting back. Assist with counseling and pharmacotherapy: Counseled for 5 minutes and literature provided. Arrange for follow up: follow up in 3 months and continue to offer help.      Hyperlipidemia    On Lipitor Check lipid profile      Relevant Medications   lisinopril (ZESTRIL) 10 MG tablet   hydrochlorothiazide  (HYDRODIURIL) 25 MG tablet   Environmental allergies    Well-controlled with Zyrtec and Clarinex, takes alternatively Flonase for nasal congestion      Chronic fatigue    Unclear etiology, could be age-related or micronutrient deficiency Checked TSH, CBC and CMP Advised to maintain adequate hydration Discussed about low dose CT chest and colonoscopy, but he denies. Advised to take multivitamin QD       Meds ordered this encounter  Medications   lisinopril (ZESTRIL) 10 MG tablet    Sig: Take 1 tablet (10 mg total) by mouth daily.    Dispense:  90 tablet    Refill:  3   hydrochlorothiazide (HYDRODIURIL) 25 MG tablet    Sig: Take 1 tablet (25 mg total) by mouth daily.    Dispense:  90 tablet    Refill:  3    Follow-up: Return in about 6 months (around 03/26/2023) for Annual physical.    Anabel Halon, MD

## 2022-09-24 NOTE — Assessment & Plan Note (Addendum)
BP Readings from Last 1 Encounters:  09/24/22 134/66   Well-controlled with Lisinopril and HCTZ Counseled for compliance with the medications Advised DASH diet and moderate exercise/walking as tolerated

## 2022-09-24 NOTE — Assessment & Plan Note (Signed)
Unclear etiology, could be age-related or micronutrient deficiency Checked TSH, CBC and CMP Advised to maintain adequate hydration Discussed about low dose CT chest and colonoscopy, but he denies. Advised to take multivitamin QD

## 2022-09-24 NOTE — Assessment & Plan Note (Signed)
Smokes about 0.5 pack/day ? ?Asked about quitting: confirms that he/she currently smokes cigarettes ?Advise to quit smoking: Educated about QUITTING to reduce the risk of cancer, cardio and cerebrovascular disease. ?Assess willingness: Unwilling to quit at this time, but is working on cutting back. ?Assist with counseling and pharmacotherapy: Counseled for 5 minutes and literature provided. ?Arrange for follow up: follow up in 3 months and continue to offer help. ?

## 2022-09-24 NOTE — Assessment & Plan Note (Signed)
S/p stent placement in 2010 Denies any chest pain or dyspnea currently On aspirin and statin Followed by Cardiology 

## 2022-09-24 NOTE — Assessment & Plan Note (Signed)
On Lipitor Check lipid profile 

## 2022-10-26 ENCOUNTER — Ambulatory Visit: Payer: Medicare HMO | Admitting: Cardiology

## 2023-03-01 ENCOUNTER — Encounter: Payer: Self-pay | Admitting: Cardiology

## 2023-03-01 ENCOUNTER — Ambulatory Visit: Payer: Medicare HMO | Attending: Cardiology | Admitting: Cardiology

## 2023-03-01 VITALS — BP 132/60 | HR 75 | Ht 67.0 in | Wt 139.8 lb

## 2023-03-01 DIAGNOSIS — I2510 Atherosclerotic heart disease of native coronary artery without angina pectoris: Secondary | ICD-10-CM | POA: Diagnosis not present

## 2023-03-01 DIAGNOSIS — I10 Essential (primary) hypertension: Secondary | ICD-10-CM

## 2023-03-01 DIAGNOSIS — E782 Mixed hyperlipidemia: Secondary | ICD-10-CM | POA: Diagnosis not present

## 2023-03-01 NOTE — Patient Instructions (Signed)
Medication Instructions:  Continue all current medications.  Labwork: none  Testing/Procedures: none  Follow-Up: 6 months   Any Other Special Instructions Will Be Listed Below (If Applicable).  If you need a refill on your cardiac medications before your next appointment, please call your pharmacy.  

## 2023-03-01 NOTE — Progress Notes (Signed)
Clinical Summary [name] is a 77 y.o.male seen today for follow up of the following medical problems.     1. CAD - prior inferior MI in 2008, received BMS to RCA - NSTEMI 07/2008 due to RCA ISR, treated with cutting balloon and additional DES. LVEF 60% by LV gram at that time.   -no chest pains, no SOB/DOE - compliant with meds     2. HTN - compliant with meds    3. 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     atorvastatin (LIPITOR) 80 MG tablet TAKE 1 TABLET EVERY DAY WITH BREAKFAST 90 tablet 3   cetirizine (ZYRTEC) 10 MG tablet Take 1 tablet (10 mg total) by mouth daily. 30 tablet 11   desloratadine (CLARINEX) 5 MG tablet Take 1 tablet (5 mg total) by mouth daily. 30 tablet 5   fluticasone (FLONASE) 50 MCG/ACT nasal spray Place 2 sprays into both nostrils daily. 16 g 6   hydrochlorothiazide (HYDRODIURIL) 25 MG tablet Take 1 tablet (25 mg total) by mouth daily. 90 tablet 3   lisinopril (ZESTRIL) 10 MG tablet Take 1 tablet (10 mg total) by mouth daily. 90 tablet 3   No current facility-administered medications for  this visit.     Past Surgical History:  Procedure Laterality Date   CARDIAC CATHETERIZATION     2 stents inserted   CATARACT EXTRACTION W/PHACO Left 05/14/2016   Procedure: CATARACT EXTRACTION PHACO AND INTRAOCULAR LENS PLACEMENT (IOC);  Surgeon: Fabio Pierce, MD;  Location: [facility];  Service: Ophthalmology;  Laterality: Left;  CDE: 12.39   CATARACT EXTRACTION W/PHACO Right 07/16/2016   Procedure: CATARACT EXTRACTION PHACO AND INTRAOCULAR LENS PLACEMENT (IOC);  Surgeon: Fabio Pierce, MD;  Location: [facility];  Service: Ophthalmology;  Laterality: Right;  CDE: 17.59   CIRCUMCISION     SKIN LESION EXCISION Left    left leg and back.     No Known Allergies    No family history on file.   Social History Mr. Bondy reports that he has been smoking cigarettes. He started smoking about 56 years ago. He has a 28.1 pack-year smoking history. He has never used smokeless tobacco. Mr. Perotti reports current alcohol use of about 5.0 standard drinks of alcohol per week.   Review of Systems CONSTITUTIONAL: No weight loss, fever, chills, weakness or fatigue.  HEENT: Eyes: No visual loss, blurred vision, double vision or yellow sclerae.No hearing loss, sneezing, congestion,  runny nose or sore throat.  SKIN: No rash or itching.  CARDIOVASCULAR:per hpi  RESPIRATORY: No shortness of breath, cough or sputum.  GASTROINTESTINAL: No anorexia, nausea, vomiting or diarrhea. No abdominal pain or blood.  GENITOURINARY: No burning on urination, no polyuria NEUROLOGICAL: No headache, dizziness, syncope, paralysis, ataxia, numbness or tingling in the extremities. No change in bowel or bladder control.  MUSCULOSKELETAL: No muscle, back pain, joint pain or stiffness.  LYMPHATICS: No enlarged nodes. No history of splenectomy.  PSYCHIATRIC: No history of depression or anxiety.  ENDOCRINOLOGIC: No reports of sweating, cold or heat intolerance. No polyuria or polydipsia.  Marland Kitchen   Physical Examination Today's Vitals    03/01/23 0833  BP: 132/60  Pulse: 75  SpO2: 95%  Weight: 139 lb 12.8 oz (63.4 kg)  Height: 5\' 7"  (1.702 m)   Body mass index is 21.9 kg/m.  Gen: resting comfortably, no acute distress HEENT: no scleral icterus, pupils equal round and reactive, no palptable cervical adenopathy,  CV: RRR, no m/rg, no jvd Resp: Clear to auscultation bilaterally GI: abdomen is soft, non-tender, non-distended, normal bowel sounds, no hepatosplenomegaly MSK: extremities are warm, no edema.  Skin: warm, no rash Neuro:  no focal deficits Psych: appropriate affect   Diagnostic Studies  07/2008 Cath RESULTS: Aortic pressure was 115/54 with mean of 85. Left index   pressure was 115/10.   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   Left anterior descending artery: The left anterior descending artery   gave rise to a large diagonal Jade Burkard, septal perforator, and 2 small   diagonal branches. There was 40% narrowing in the proximal LAD after   the diagonal Jamair Cato and there was 50% narrowing in the very proximal and   ostium of the first diagonal Rizwan Kuyper. There was moderate calcification   and no irregularities.   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 This was a small vessel that was diffusely   diseased with 70-80% stenoses in its proximal to midportion. It filled   2 small marginal branches. This vessel was also significantly diseased   on the prior study.   Right coronary artery: The right coronary artery was a moderately large   vessel that gave rise to a posterior descending Jowan Skillin and a large   posterolateral Bracy Pepper. There were multiple right angle turns in the   proximal vessel and there was a long stent in the midvessel which   traversed to a right angle bend. There was a 90% focal stenosis just   before the second bend and the midvessel. There was also 70-80%    narrowing in the distal right coronary artery which extended just to the   posterior descending Elisabella Hacker. There was 60% narrowing in the   posterolateral Brenlynn Fake.   Left ventriculogram: The left ventriculogram performed in the RAO   projection showed hypokinesis of the inferobasal segment. The overall   wall motion was good with an estimated ejection fraction of 60%.   Following cutting balloon angioplasty, the lesion within the stent in   the mid-right coronary stenosis improved from 90% to 10%.   Following stenting of the lesion, the distal right coronary stenosis   improved from 80% to 0%..   CONCLUSION:   1. 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   2. Successful percutaneous coronary intervention of the in-stent   restenotic lesion in the mid-right coronary using a cutting balloon   angioplasty with improvement in central narrowing from 90% to 10%.   3. Successful percutaneous coronary intervention of the lesion in the   distal right coronary using a Xience drug-eluting stent with   improvement in central narrowing from 80% to 0%.   DISPOSITION: The patient returned to the [facility] room for further   observation. Recommend Plavix for at least a year.         10/19/13 Clinic EKG NSR   10/2013 AAA US IMPRESSION: No evidence of abdominal aortic aneurysm .     Assessment and Plan   1. CAD - no recent symptoms, continue current meds   2. HTN - his bp is at goal, continue current meds   3. Hyperlipidemia - LDL goal would be <55 in setting of two prior  MIs, has been at goal by most recent labs. Has upcoming labs with pcp - continue current meds   F/u 6 months  Antoine Poche, M.D.

## 2023-03-10 ENCOUNTER — Ambulatory Visit (INDEPENDENT_AMBULATORY_CARE_PROVIDER_SITE_OTHER): Payer: Medicare HMO | Admitting: Internal Medicine

## 2023-03-10 ENCOUNTER — Encounter: Payer: Self-pay | Admitting: Internal Medicine

## 2023-03-10 VITALS — BP 124/64 | HR 88 | Ht 67.0 in | Wt 137.8 lb

## 2023-03-10 DIAGNOSIS — I10 Essential (primary) hypertension: Secondary | ICD-10-CM | POA: Diagnosis not present

## 2023-03-10 DIAGNOSIS — E559 Vitamin D deficiency, unspecified: Secondary | ICD-10-CM | POA: Diagnosis not present

## 2023-03-10 DIAGNOSIS — E782 Mixed hyperlipidemia: Secondary | ICD-10-CM | POA: Diagnosis not present

## 2023-03-10 DIAGNOSIS — J309 Allergic rhinitis, unspecified: Secondary | ICD-10-CM | POA: Diagnosis not present

## 2023-03-10 DIAGNOSIS — Z9109 Other allergy status, other than to drugs and biological substances: Secondary | ICD-10-CM

## 2023-03-10 DIAGNOSIS — I2510 Atherosclerotic heart disease of native coronary artery without angina pectoris: Secondary | ICD-10-CM | POA: Diagnosis not present

## 2023-03-10 DIAGNOSIS — Z720 Tobacco use: Secondary | ICD-10-CM

## 2023-03-10 DIAGNOSIS — R7303 Prediabetes: Secondary | ICD-10-CM | POA: Diagnosis not present

## 2023-03-10 DIAGNOSIS — Z23 Encounter for immunization: Secondary | ICD-10-CM | POA: Diagnosis not present

## 2023-03-10 DIAGNOSIS — Z125 Encounter for screening for malignant neoplasm of prostate: Secondary | ICD-10-CM | POA: Diagnosis not present

## 2023-03-10 MED ORDER — FLUTICASONE PROPIONATE 50 MCG/ACT NA SUSP: 2.0000 | Freq: Every day | NASAL | 6 refills | Status: DC

## 2023-03-10 NOTE — Assessment & Plan Note (Signed)
Smokes about 0.5 pack/day ? ?Asked about quitting: confirms that he/she currently smokes cigarettes ?Advise to quit smoking: Educated about QUITTING to reduce the risk of cancer, cardio and cerebrovascular disease. ?Assess willingness: Unwilling to quit at this time, but is working on cutting back. ?Assist with counseling and pharmacotherapy: Counseled for 5 minutes and literature provided. ?Arrange for follow up: follow up in 3 months and continue to offer help. ?

## 2023-03-10 NOTE — Assessment & Plan Note (Addendum)
Uncontrolled with Allegra Has tried Zyrtec and Clarinex without relief Flonase for nasal congestion - refilled

## 2023-03-10 NOTE — Assessment & Plan Note (Signed)
On Lipitor Check lipid profile

## 2023-03-10 NOTE — Assessment & Plan Note (Signed)
S/p stent placement in 2010 Denies any chest pain or dyspnea currently On aspirin and statin Followed by Cardiology

## 2023-03-10 NOTE — Assessment & Plan Note (Signed)
Lab Results  Component Value Date   HGBA1C 5.5 03/24/2022   Advised to follow DASH diet

## 2023-03-10 NOTE — Assessment & Plan Note (Signed)
Ordered PSA after discussing its limitations for prostate cancer screening, including false positive results leading to additional investigations. 

## 2023-03-10 NOTE — Progress Notes (Signed)
Established Patient Office Visit  Subjective:  Patient ID: [name], male    DOB: 07/20/1945  Age: 77 y.o. MRN: [id number]  CC:  Chief Complaint  Patient presents with   Hypertension    Five month follow up     HPI [name] is a 77 y.o. male with past medical history of CAD, HTN, HLD and tobacco abuse who presents for f/u of his chronic medical conditions.  HTN and CAD: BP is well-controlled. Takes medications regularly. Patient denies headache, dizziness, chest pain, dyspnea or palpitations.  He complains of chronic nasal congestion, postnasal drip and sinus pressure related headache.  He has intermittent dry cough as well.  Denies any fever or chills currently.  He is currently taking Allegra with mild relief.  He has tried Zyrtec, Claritin and Clarinex without much relief.  He had noticed improvement with Flonase, but has not used it recently.  He smokes about 0.5 pack/day.  He is trying to cut down smoking currently.  Of note, he denies any dyspnea or wheezing currently.   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    Arthritis    Erectile dysfunction    Gout    acute gouty arthropathy   Hyperlipidemia    Hypertension    Tobacco abuse    60 pack years continuing at one pack per day     Past Surgical History:  Procedure Laterality Date   CARDIAC CATHETERIZATION     2 stents inserted   CATARACT EXTRACTION W/PHACO Left 05/14/2016   Procedure: CATARACT EXTRACTION PHACO AND INTRAOCULAR LENS PLACEMENT (IOC);  Surgeon: Fabio Pierce, MD;  Location: [facility];  Service: Ophthalmology;  Laterality: Left;  CDE: 12.39   CATARACT EXTRACTION W/PHACO Right 07/16/2016    Procedure: CATARACT EXTRACTION PHACO AND INTRAOCULAR LENS PLACEMENT (IOC);  Surgeon: Fabio Pierce, MD;  Location: [facility];  Service: Ophthalmology;  Laterality: Right;  CDE: 17.59   CIRCUMCISION     SKIN LESION EXCISION Left    left leg and back.    History reviewed. No pertinent family history.  Social History   Socioeconomic History   Marital status: Widowed    Spouse name: Not on file   Number of children: 2   Years of education: Not on file   Highest education level: Not on file  Occupational History   Occupation: Retired (5 years as of 02/18/21)-    Comment: worked at [employer] in [employer]  Tobacco Use   Smoking status: Every Day    Current packs/day: 0.50    Average packs/day: 0.5 packs/day for 56.3 years (28.2 ttl pk-yrs)    Types: Cigarettes    Start date: 11/12/1966   Smokeless tobacco: Never  Vaping Use   Vaping status: Never Used  Substance and Sexual Activity   Alcohol use: Yes    Alcohol/week: 5.0 standard drinks of alcohol    Types: 5 Cans of beer per week   Drug use: No   Sexual activity: Not Currently    Birth control/protection: None  Other Topics Concern   Not on file  Social History Narrative   Not on file   Social Determinants of Health   Financial Resource Strain: Low  Risk  (04/16/2021)   Overall Financial Resource Strain (CARDIA)    Difficulty of Paying Living Expenses: Not hard at all  Food Insecurity: No Food Insecurity (04/27/2022)   Hunger Vital Sign    Worried About Running Out of Food in the Last Year: Never true    Ran Out of Food in the Last Year: Never true  Transportation Needs: No Transportation Needs (04/27/2022)   PRAPARE - Administrator, Civil Service (Medical): No    Lack of Transportation (Non-Medical): No  Physical Activity: Sufficiently Active (04/16/2021)   Exercise Vital Sign    Days of Exercise per Week: 6 days    Minutes of Exercise per Session: 60 min  Stress: No Stress Concern Present (04/16/2021)    [employer] of Occupational Health - Occupational Stress Questionnaire    Feeling of Stress : Not at all  Social Connections: Socially Isolated (04/27/2022)   Social Connection and Isolation Panel [NHANES]    Frequency of Communication with Friends and Family: Three times a week    Frequency of Social Gatherings with Friends and Family: Once a week    Attends Religious Services: Never    Database administrator or Organizations: No    Attends Banker Meetings: Never    Marital Status: Widowed  Intimate Partner Violence: Not At Risk (04/16/2021)   Humiliation, Afraid, Rape, and Kick questionnaire    Fear of Current or Ex-Partner: No    Emotionally Abused: No    Physically Abused: No    Sexually Abused: No    Outpatient Medications Prior to Visit  Medication Sig Dispense Refill   aspirin 81 MG tablet Take 81 mg by mouth daily.     atorvastatin (LIPITOR) 80 MG tablet TAKE 1 TABLET EVERY DAY WITH BREAKFAST 90 tablet 3   hydrochlorothiazide (HYDRODIURIL) 25 MG tablet Take 1 tablet (25 mg total) by mouth daily. 90 tablet 3   lisinopril (ZESTRIL) 10 MG tablet Take 1 tablet (10 mg total) by mouth daily. 90 tablet 3   cetirizine (ZYRTEC) 10 MG tablet Take 1 tablet (10 mg total) by mouth daily. 30 tablet 11   desloratadine (CLARINEX) 5 MG tablet Take 1 tablet (5 mg total) by mouth daily. (Patient not taking: Reported on 03/01/2023) 30 tablet 5   fluticasone (FLONASE) 50 MCG/ACT nasal spray Place 2 sprays into both nostrils daily. (Patient not taking: Reported on 03/01/2023) 16 g 6   No facility-administered medications prior to visit.    No Known Allergies  ROS Review of Systems  Constitutional:  Positive for fatigue. Negative for chills and fever.  HENT:  Positive for congestion, postnasal drip and sinus pressure. Negative for sore throat.   Eyes:  Negative for pain and discharge.  Respiratory:  Positive for cough. Negative for shortness of breath.   Cardiovascular:   Negative for chest pain and palpitations.  Gastrointestinal:  Negative for constipation, diarrhea, nausea and vomiting.  Endocrine: Negative for polydipsia and polyuria.  Genitourinary:  Negative for dysuria and hematuria.  Musculoskeletal:  Negative for neck pain and neck stiffness.  Skin:  Negative for rash.  Neurological:  Negative for dizziness, weakness, numbness and headaches.  Psychiatric/Behavioral:  Negative for agitation and behavioral problems.       Objective:    Physical Exam Vitals reviewed.  Constitutional:      General: He is not in acute distress.    Appearance: He is not diaphoretic.  HENT:     Head: Normocephalic and atraumatic.  Nose: Congestion present.     Right Sinus: Frontal sinus tenderness present. No maxillary sinus tenderness.     Left Sinus: Frontal sinus tenderness present. No maxillary sinus tenderness.     Mouth/Throat:     Mouth: Mucous membranes are moist.  Eyes:     General: No scleral icterus.    Extraocular Movements: Extraocular movements intact.  Cardiovascular:     Rate and Rhythm: Normal rate and regular rhythm.     Pulses: Normal pulses.     Heart sounds: Normal heart sounds. No murmur heard. Pulmonary:     Breath sounds: Normal breath sounds. No wheezing or rales.  Musculoskeletal:     Cervical back: Neck supple. No tenderness.     Right lower leg: No edema.     Left lower leg: No edema.  Skin:    General: Skin is warm.     Findings: No rash.  Neurological:     General: No focal deficit present.     Mental Status: He is alert and oriented to person, place, and time.     Sensory: No sensory deficit.     Motor: No weakness.  Psychiatric:        Mood and Affect: Mood normal.        Behavior: Behavior normal.     BP 124/64 (BP Location: Right Arm, Patient Position: Sitting, Cuff Size: Normal)   Pulse 88   Ht 5\' 7"  (1.702 m)   Wt 137 lb 12.8 oz (62.5 kg)   SpO2 96%   BMI 21.58 kg/m  Wt Readings from Last 3 Encounters:   03/10/23 137 lb 12.8 oz (62.5 kg)  03/01/23 139 lb 12.8 oz (63.4 kg)  09/24/22 141 lb 3.2 oz (64 kg)    Lab Results  Component Value Date   TSH 0.648 03/24/2022   Lab Results  Component Value Date   WBC 6.7 03/24/2022   HGB 14.4 03/24/2022   HCT 43.7 03/24/2022   MCV 95 03/24/2022   PLT 246 03/24/2022   Lab Results  Component Value Date   NA 133 (L) 03/24/2022   K 5.1 03/24/2022   CO2 22 03/24/2022   GLUCOSE 93 03/24/2022   BUN 11 03/24/2022   CREATININE 0.77 03/24/2022   BILITOT 0.4 03/24/2022   ALKPHOS 62 03/24/2022   AST 17 03/24/2022   ALT 17 03/24/2022   PROT 6.8 03/24/2022   ALBUMIN 4.4 03/24/2022   CALCIUM 9.4 03/24/2022   ANIONGAP 8 05/12/2016   EGFR 94 03/24/2022   Lab Results  Component Value Date   CHOL 122 03/24/2022   Lab Results  Component Value Date   HDL 52 03/24/2022   Lab Results  Component Value Date   LDLCALC 45 03/24/2022   Lab Results  Component Value Date   TRIG 145 03/24/2022   Lab Results  Component Value Date   CHOLHDL 2.3 03/24/2022   Lab Results  Component Value Date   HGBA1C 5.5 03/24/2022      Assessment & Plan:   Problem List Items Addressed This Visit       Cardiovascular and Mediastinum   Hypertension    BP Readings from Last 1 Encounters:  03/10/23 124/64   Well-controlled with Lisinopril and HCTZ Counseled for compliance with the medications Advised DASH diet and moderate exercise/walking as tolerated      Relevant Orders   TSH   CMP14+EGFR   CBC with Differential/Platelet   Coronary artery disease involving native coronary artery of native heart without  angina pectoris - Primary    S/p stent placement in 2010 Denies any chest pain or dyspnea currently On aspirin and statin Followed by Cardiology      Relevant Orders   Lipid panel     Respiratory   Allergic sinusitis    His symptoms are likely due to allergic sinusitis Continue Allegra for now, has tried Zyrtec and Clarinex Needs to use  Flonase regularly      Relevant Medications   fluticasone (FLONASE) 50 MCG/ACT nasal spray     Other   Tobacco abuse    Smokes about 0.5 pack/day  Asked about quitting: confirms that he/she currently smokes cigarettes Advise to quit smoking: Educated about QUITTING to reduce the risk of cancer, cardio and cerebrovascular disease. Assess willingness: Unwilling to quit at this time, but is working on cutting back. Assist with counseling and pharmacotherapy: Counseled for 5 minutes and literature provided. Arrange for follow up: follow up in 3 months and continue to offer help.      Hyperlipidemia    On Lipitor Check lipid profile      Relevant Orders   Lipid panel   Environmental allergies    Uncontrolled with Allegra Has tried Zyrtec and Clarinex without relief Flonase for nasal congestion - refilled      Relevant Medications   fluticasone (FLONASE) 50 MCG/ACT nasal spray   Other Relevant Orders   CBC with Differential/Platelet   Prostate cancer screening    Ordered PSA after discussing its limitations for prostate cancer screening, including false positive results leading to additional investigations.      Relevant Orders   PSA   Prediabetes    Lab Results  Component Value Date   HGBA1C 5.5 03/24/2022   Advised to follow DASH diet      Relevant Orders   Hemoglobin A1c   CMP14+EGFR   Other Visit Diagnoses     Vitamin D deficiency       Relevant Orders   VITAMIN D 25 Hydroxy (Vit-D Deficiency, Fractures)   Encounter for immunization       Relevant Orders   Flu Vaccine Trivalent High Dose (Fluad) (Completed)       Meds ordered this encounter  Medications   fluticasone (FLONASE) 50 MCG/ACT nasal spray    Sig: Place 2 sprays into both nostrils daily.    Dispense:  16 g    Refill:  6    Follow-up: Return in about 6 months (around 09/07/2023) for Annual physical.    Anabel Halon, MD

## 2023-03-10 NOTE — Assessment & Plan Note (Signed)
His symptoms are likely due to allergic sinusitis Continue Allegra for now, has tried Zyrtec and Clarinex Needs to use Flonase regularly

## 2023-03-10 NOTE — Assessment & Plan Note (Signed)
BP Readings from Last 1 Encounters:  03/10/23 124/64   Well-controlled with Lisinopril and HCTZ Counseled for compliance with the medications Advised DASH diet and moderate exercise/walking as tolerated

## 2023-03-10 NOTE — Patient Instructions (Signed)
Please continue to take medications as prescribed.  Please continue to follow low salt diet and perform moderate exercise/walking as tolerated.  Please consider getting Shingrix and Tdap vaccine at local pharmacy.

## 2023-03-11 LAB — CMP14+EGFR
ALT: 23 [IU]/L (ref 0–44)
AST: 22 [IU]/L (ref 0–40)
Albumin: 4.5 g/dL (ref 3.8–4.8)
Alkaline Phosphatase: 57 [IU]/L (ref 44–121)
BUN/Creatinine Ratio: 13 (ref 10–24)
BUN: 11 mg/dL (ref 8–27)
Bilirubin Total: 0.5 mg/dL (ref 0.0–1.2)
CO2: 23 mmol/L (ref 20–29)
Calcium: 9.1 mg/dL (ref 8.6–10.2)
Chloride: 103 mmol/L (ref 96–106)
Creatinine, Ser: 0.84 mg/dL (ref 0.76–1.27)
Globulin, Total: 2.6 g/dL (ref 1.5–4.5)
Glucose: 87 mg/dL (ref 70–99)
Potassium: 5.1 mmol/L (ref 3.5–5.2)
Sodium: 141 mmol/L (ref 134–144)
Total Protein: 7.1 g/dL (ref 6.0–8.5)
eGFR: 91 mL/min/{1.73_m2} (ref >59)

## 2023-03-11 LAB — LIPID PANEL
Chol/HDL Ratio: 2.7 {ratio} (ref 0.0–5.0)
Cholesterol, Total: 140 mg/dL (ref 100–199)
HDL: 51 mg/dL (ref >39)
LDL Chol Calc (NIH): 62 mg/dL (ref 0–99)
Triglycerides: 158 mg/dL — ABNORMAL HIGH (ref 0–149)
VLDL Cholesterol Cal: 27 mg/dL (ref 5–40)

## 2023-03-11 LAB — CBC WITH DIFFERENTIAL/PLATELET
Basophils Absolute: 0.1 10*3/uL (ref 0.0–0.2)
Basos: 1 %
EOS (ABSOLUTE): 0.3 10*3/uL (ref 0.0–0.4)
Eos: 5 %
Hematocrit: 45.2 % (ref 37.5–51.0)
Hemoglobin: 14.4 g/dL (ref 13.0–17.7)
Immature Grans (Abs): 0 10*3/uL (ref 0.0–0.1)
Immature Granulocytes: 0 %
Lymphocytes Absolute: 1.7 10*3/uL (ref 0.7–3.1)
Lymphs: 25 %
MCH: 31.5 pg (ref 26.6–33.0)
MCHC: 31.9 g/dL (ref 31.5–35.7)
MCV: 99 fL — ABNORMAL HIGH (ref 79–97)
Monocytes Absolute: 0.6 10*3/uL (ref 0.1–0.9)
Monocytes: 9 %
Neutrophils Absolute: 4 10*3/uL (ref 1.4–7.0)
Neutrophils: 60 %
Platelets: 210 10*3/uL (ref 150–450)
RBC: 4.57 x10E6/uL (ref 4.14–5.80)
RDW: 13 % (ref 11.6–15.4)
WBC: 6.6 10*3/uL (ref 3.4–10.8)

## 2023-03-11 LAB — TSH: TSH: 0.692 u[IU]/mL (ref 0.450–4.500)

## 2023-03-11 LAB — HEMOGLOBIN A1C
Est. average glucose Bld gHb Est-mCnc: 111 mg/dL
Hgb A1c MFr Bld: 5.5 % (ref 4.8–5.6)

## 2023-03-11 LAB — VITAMIN D 25 HYDROXY (VIT D DEFICIENCY, FRACTURES): Vit D, 25-Hydroxy: 21 ng/mL — ABNORMAL LOW (ref 30.0–100.0)

## 2023-03-11 LAB — PSA: Prostate Specific Ag, Serum: 0.9 ng/mL (ref 0.0–4.0)

## 2023-03-28 ENCOUNTER — Ambulatory Visit: Payer: Medicare HMO | Admitting: Internal Medicine

## 2023-05-09 ENCOUNTER — Other Ambulatory Visit: Payer: Self-pay

## 2023-05-09 MED ORDER — ATORVASTATIN CALCIUM 80 MG PO TABS: ORAL_TABLET | ORAL | 3 refills | Status: DC

## 2023-05-11 ENCOUNTER — Ambulatory Visit: Payer: Medicare HMO

## 2023-05-11 VITALS — Ht 67.0 in | Wt 139.0 lb

## 2023-05-11 DIAGNOSIS — Z0000 Encounter for general adult medical examination without abnormal findings: Secondary | ICD-10-CM

## 2023-05-11 NOTE — Progress Notes (Signed)
Subjective:   [name] is a 77 y.o. male who presents for Medicare Annual/Subsequent preventive examination.  Visit Complete: Virtual I connected with  [name] on 05/11/23 by a audio enabled telemedicine application and verified that I am speaking with the correct person using two identifiers.  Patient Location: Home  Provider Location: Office/Clinic  I discussed the limitations of evaluation and management by telemedicine. The patient expressed understanding and agreed to proceed.  Vital Signs: Because this visit was a virtual/telehealth visit, some criteria may be missing or patient reported. Any vitals not documented were not able to be obtained and vitals that have been documented are patient reported.  Cardiac Risk Factors include: advanced age (>70men, >95 women);dyslipidemia;hypertension;male gender;sedentary lifestyle;smoking/ tobacco exposure     Objective:    Today's Vitals   05/11/23 1131  Weight: 139 lb (63 kg)  Height: 5\' 7"  (1.702 m)  PainSc: 0-No pain   Body mass index is 21.77 kg/m.     05/11/2023   11:33 AM 04/16/2021   10:14 AM 07/16/2016   10:45 AM 05/14/2016    6:29 AM 05/12/2016    8:56 AM  Advanced Directives  Does Patient Have a Medical Advance Directive? No No No No No  Would patient like information on creating a medical advance directive? No - Patient declined No - Patient declined No - Patient declined No - Patient declined No - Patient declined    Current Medications (verified) Outpatient Encounter Medications as of 05/11/2023  Medication Sig   aspirin 81 MG tablet Take 81 mg by mouth daily.   atorvastatin (LIPITOR) 80 MG tablet TAKE 1 TABLET EVERY DAY WITH BREAKFAST   fluticasone (FLONASE) 50 MCG/ACT nasal spray Place 2 sprays into both nostrils daily.   hydrochlorothiazide (HYDRODIURIL) 25 MG tablet Take 1 tablet (25 mg total) by mouth daily.   lisinopril (ZESTRIL) 10 MG tablet Take 1 tablet (10 mg total) by mouth daily.   No  facility-administered encounter medications on file as of 05/11/2023.    Allergies (verified) Patient has no known allergies.   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    Arthritis    Erectile dysfunction    Gout    acute gouty arthropathy   Hyperlipidemia    Hypertension    Tobacco abuse    60 pack years continuing at one pack per day    Past Surgical History:  Procedure Laterality Date   CARDIAC CATHETERIZATION     2 stents inserted   CATARACT EXTRACTION W/PHACO Left 05/14/2016   Procedure: CATARACT EXTRACTION PHACO AND INTRAOCULAR LENS PLACEMENT (IOC);  Surgeon: Fabio Pierce, MD;  Location: [facility];  Service: Ophthalmology;  Laterality: Left;  CDE: 12.39   CATARACT EXTRACTION W/PHACO Right 07/16/2016   Procedure: CATARACT EXTRACTION PHACO AND INTRAOCULAR LENS PLACEMENT (IOC);  Surgeon: Fabio Pierce, MD;  Location: [facility];  Service: Ophthalmology;  Laterality: Right;  CDE: 17.59   CIRCUMCISION     SKIN LESION EXCISION Left    left leg and back.   History reviewed. No pertinent family history. Social History   Socioeconomic History   Marital status: Widowed    Spouse name: Not on file   Number of children: 2  Years of education: Not on file   Highest education level: Not on file  Occupational History   Occupation: Retired (5 years as of 02/18/21)-    Comment: worked at [employer] in [employer]  Tobacco Use   Smoking status: Every Day    Current packs/day: 0.50    Average packs/day: 0.5 packs/day for 56.5 years (28.2 ttl pk-yrs)    Types: Cigarettes    Start date: 11/12/1966   Smokeless tobacco: Never  Vaping Use   Vaping status: Never Used  Substance  and Sexual Activity   Alcohol use: Yes    Alcohol/week: 5.0 standard drinks of alcohol    Types: 5 Cans of beer per week   Drug use: No   Sexual activity: Not Currently    Birth control/protection: None  Other Topics Concern   Not on file  Social History Narrative   Not on file   Social Determinants of Health   Financial Resource Strain: Low Risk  (05/11/2023)   Overall Financial Resource Strain (CARDIA)    Difficulty of Paying Living Expenses: Not hard at all  Food Insecurity: No Food Insecurity (05/11/2023)   Hunger Vital Sign    Worried About Running Out of Food in the Last Year: Never true    Ran Out of Food in the Last Year: Never true  Transportation Needs: No Transportation Needs (05/11/2023)   PRAPARE - Administrator, Civil Service (Medical): No    Lack of Transportation (Non-Medical): No  Physical Activity: Inactive (05/11/2023)   Exercise Vital Sign    Days of Exercise per Week: 0 days    Minutes of Exercise per Session: 0 min  Stress: No Stress Concern Present (05/11/2023)   [employer] of Occupational Health - Occupational Stress Questionnaire    Feeling of Stress : Not at all  Social Connections: Socially Isolated (05/11/2023)   Social Connection and Isolation Panel [NHANES]    Frequency of Communication with Friends and Family: Three times a week    Frequency of Social Gatherings with Friends and Family: Once a week    Attends Religious Services: Never    Database administrator or Organizations: No    Attends Banker Meetings: Never    Marital Status: Widowed    Tobacco Counseling Ready to quit: Not Answered Counseling given: Not Answered   Clinical Intake:  Pre-visit preparation completed: Yes  Pain : No/denies pain Pain Score: 0-No pain     BMI - recorded: 21.77 Nutritional Status: BMI of 19-24  Normal Nutritional Risks: None Diabetes: No  How often do you need to have someone help you when you read  instructions, pamphlets, or other written materials from your doctor or pharmacy?: 1 - Never What is the last grade level you completed in school?: 9TH GRADE  Interpreter Needed?: No  Information entered by :: Albirtha Grinage N. Esmerelda Finnigan, LPN.   Activities of Daily Living    05/11/2023   11:35 AM  In your present state of health, do you have any difficulty performing the following activities:  Hearing? 0  Vision? 0  Difficulty concentrating or making decisions? 0  Walking or climbing stairs? 0  Dressing or bathing? 0  Doing errands, shopping? 0  Preparing Food and eating ? N  Using the Toilet? N  In the past six months, have you accidently leaked urine? N  Do you have problems with loss of bowel control? N  Managing your Medications? N  Managing your Finances? N  Housekeeping or managing your Housekeeping? N    Patient Care Team: Anabel Halon, MD as PCP - General (Internal Medicine) Wyline Mood Dorothe Pea, MD as PCP - Cardiology (Cardiology) Wyline Mood Dorothe Pea, MD as Consulting Physician (Cardiology) [facility] [place] as Consulting Physician (Optometry)  Indicate any recent Medical Services you may have received from other than [facility] providers in the past year (date may be approximate).     Assessment:   This is a routine wellness examination for Jakeob.  Hearing/Vision screen Hearing Screening - Comments:: Patient denied any hearing difficulty.   No hearing aids.   Vision Screening - Comments:: Patient does wear corrective lenses/contacts/readers.  Annual eye exam done by: [facility]    Goals Addressed             This Visit's Progress    Patient Stated       To take it one day at a time.      Depression Screen    05/11/2023   11:34 AM 03/10/2023    8:38 AM 09/24/2022    8:57 AM 04/27/2022    8:08 AM 03/24/2022    8:54 AM 09/21/2021    9:38 AM 04/16/2021   10:14 AM  PHQ 2/9 Scores  PHQ - 2 Score 0 0 0 0 0 0 0  PHQ- 9 Score 1           Fall Risk    05/11/2023   11:34 AM 03/10/2023    8:38 AM 09/24/2022    8:57 AM 04/27/2022    8:08 AM 03/24/2022    8:54 AM  Fall Risk   Falls in the past year? 0 0 0 0 0  Number falls in past yr: 0 0 0 0 0  Injury with Fall? 0 0 0 0 0  Risk for fall due to : No Fall Risks   No Fall Risks No Fall Risks  Follow up Falls prevention discussed   Falls evaluation completed Falls evaluation completed    MEDICARE RISK AT HOME: Medicare Risk at Home Any stairs in or around the home?: No If so, are there any without handrails?: No Home free of loose throw rugs in walkways, pet beds, electrical cords, etc?: Yes Adequate lighting in your home to reduce risk of falls?: Yes Life alert?: No Use of a cane, walker or w/c?: No Grab bars in the bathroom?: Yes Shower chair or bench in shower?: Yes Elevated toilet seat or a handicapped toilet?: No  TIMED UP AND GO:  Was the test performed?  No    Cognitive Function:    05/11/2023   11:35 AM 04/16/2021   10:16 AM  MMSE - Mini Mental State Exam  Not completed: Unable to complete Unable to complete        05/11/2023   11:35 AM 04/27/2022    8:10 AM 04/16/2021   10:16 AM  6CIT Screen  What Year? 0 points 0 points 0 points  What month? 0 points 0 points 0 points  What time? 0 points 0 points 0 points  Count back from 20 0 points 0 points 0 points  Months in reverse 0 points 4 points 4 points  Repeat phrase 0 points 10 points 0 points  Total Score 0 points 14 points 4 points    Immunizations Immunization History  Administered Date(s) Administered   Fluad Quad(high Dose 65+) 03/20/2021, 03/24/2022   Fluad Trivalent(High Dose 65+) 03/10/2023   Janssen (J&J) SARS-COV-2 Vaccination 11/25/2019, 05/01/2020  PNEUMOCOCCAL CONJUGATE-20 09/21/2021    TDAP status: Due, Education has been provided regarding the importance of this vaccine. Advised may receive this vaccine at local pharmacy or Health Dept. Aware to provide a copy of the  vaccination record if obtained from local pharmacy or Health Dept. Verbalized acceptance and understanding.  Flu Vaccine status: Up to date  Pneumococcal vaccine status: Up to date  Covid-19 vaccine status: Completed vaccines  Qualifies for Shingles Vaccine? Yes   Zostavax completed No   Shingrix Completed?: No.    Education has been provided regarding the importance of this vaccine. Patient has been advised to call insurance company to determine out of pocket expense if they have not yet received this vaccine. Advised may also receive vaccine at local pharmacy or Health Dept. Verbalized acceptance and understanding.  Screening Tests Health Maintenance  Topic Date Due   DTaP/Tdap/Td (1 - Tdap) Never done   Colonoscopy  Never done   Lung Cancer Screening  Never done   Zoster Vaccines- Shingrix (1 of 2) Never done   COVID-19 Vaccine (3 - 2023-24 season) 02/13/2023   Medicare Annual Wellness (AWV)  05/10/2024   Pneumonia Vaccine 78+ Years old  Completed   INFLUENZA VACCINE  Completed   Hepatitis C Screening  Completed   HPV VACCINES  Aged Out    Health Maintenance  Health Maintenance Due  Topic Date Due   DTaP/Tdap/Td (1 - Tdap) Never done   Colonoscopy  Never done   Lung Cancer Screening  Never done   Zoster Vaccines- Shingrix (1 of 2) Never done   COVID-19 Vaccine (3 - 2023-24 season) 02/13/2023    Colorectal cancer screening: No longer required.   Lung Cancer Screening: (Low Dose CT Chest recommended if Age 41-80 years, 20 pack-year currently smoking OR have quit w/in 15years.) does qualify.   Lung Cancer Screening Referral: patient declined.  Additional Screening:  Hepatitis C Screening: does qualify; Completed 03/20/2021  Vision Screening: Recommended annual ophthalmology exams for early detection of glaucoma and other disorders of the eye. Is the patient up to date with their annual eye exam?  Yes  Who is the provider or what is the name of the office in which the  patient attends annual eye exams? [facility] If pt is not established with a provider, would they like to be referred to a provider to establish care? No .   Dental Screening: Recommended annual dental exams for proper oral hygiene  Diabetic Foot Exam: N/A  Community Resource Referral / Chronic Care Management: CRR required this visit?  No   CCM required this visit?  No     Plan:     I have personally reviewed and noted the following in the patient's chart:   Medical and social history Use of alcohol, tobacco or illicit drugs  Current medications and supplements including opioid prescriptions. Patient is not currently taking opioid prescriptions. Functional ability and status Nutritional status Physical activity Advanced directives List of other physicians Hospitalizations, surgeries, and ER visits in previous 12 months Vitals Screenings to include cognitive, depression, and falls Referrals and appointments  In addition, I have reviewed and discussed with patient certain preventive protocols, quality metrics, and best practice recommendations. A written personalized care plan for preventive services as well as general preventive health recommendations were provided to patient.     Mickeal Needy, LPN   09/81/1914   After Visit Summary: (Mail) Due to this being a telephonic visit, the after visit summary with patients personalized plan  was offered to patient via mail   Nurse Notes: None

## 2023-05-11 NOTE — Patient Instructions (Signed)
[name] , Thank you for taking time to come for your Medicare Wellness Visit. I appreciate your ongoing commitment to your health goals. Please review the following plan we discussed and let me know if I can assist you in the future.   Referrals/Orders/Follow-Ups/Clinician Recommendations: No  This is a list of the screening recommended for you and due dates:  Health Maintenance  Topic Date Due   DTaP/Tdap/Td vaccine (1 - Tdap) Never done   Colon Cancer Screening  Never done   Screening for Lung Cancer  Never done   Zoster (Shingles) Vaccine (1 of 2) Never done   COVID-19 Vaccine (3 - 2023-24 season) 02/13/2023   Medicare Annual Wellness Visit  05/10/2024   Pneumonia Vaccine  Completed   Flu Shot  Completed   Hepatitis C Screening  Completed   HPV Vaccine  Aged Out    Advanced directives: (Declined) Advance directive discussed with you today. Even though you declined this today, please call our office should you change your mind, and we can give you the proper paperwork for you to fill out.  Next Medicare Annual Wellness Visit scheduled for next year: Yes

## 2023-09-05 ENCOUNTER — Ambulatory Visit: Payer: Medicare HMO | Attending: Cardiology | Admitting: Cardiology

## 2023-09-05 ENCOUNTER — Encounter: Payer: Self-pay | Admitting: Cardiology

## 2023-09-05 VITALS — BP 134/60 | HR 84 | Ht 67.0 in | Wt 137.8 lb

## 2023-09-05 DIAGNOSIS — I10 Essential (primary) hypertension: Secondary | ICD-10-CM | POA: Diagnosis not present

## 2023-09-05 DIAGNOSIS — I2510 Atherosclerotic heart disease of native coronary artery without angina pectoris: Secondary | ICD-10-CM

## 2023-09-05 DIAGNOSIS — E782 Mixed hyperlipidemia: Secondary | ICD-10-CM

## 2023-09-05 MED ORDER — ROSUVASTATIN CALCIUM 40 MG PO TABS: 40.0000 mg | ORAL_TABLET | Freq: Every day | ORAL | 6 refills | Status: DC

## 2023-09-05 NOTE — Patient Instructions (Signed)
Medication Instructions:  Stop Atorvastatin (Lipitor)  Begin Crestor '40mg'$  daily  Continue all other medications.    Labwork: none  Testing/Procedures: none  Follow-Up: 6 months   Any Other Special Instructions Will Be Listed Below (If Applicable).  If you need a refill on your cardiac medications before your next appointment, please call your pharmacy.

## 2023-09-05 NOTE — Progress Notes (Signed)
 Clinical Summary Mr. Rowland is a 78 y.o.male seen today for follow up of the following medical problems.      1. CAD - prior inferior MI in 2008, received BMS to RCA - NSTEMI 07/2008 due to RCA ISR, treated with cutting balloon and additional DES. LVEF 60% by LV gram at that time.   - no chest pains, no SOB/DOE - compliant with meds -EKG today NSR, no ischemic changes  2. HTN - he is compliant with meds     3. 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     atorvastatin (LIPITOR) 80 MG tablet TAKE 1 TABLET EVERY DAY WITH BREAKFAST 90 tablet 3   fluticasone (FLONASE) 50 MCG/ACT nasal spray Place 2 sprays into both nostrils daily. 16 g 6   hydrochlorothiazide (HYDRODIURIL) 25 MG tablet Take 1 tablet (25 mg total) by mouth daily. 90 tablet 3   lisinopril (ZESTRIL) 10 MG tablet Take 1 tablet (10 mg total) by mouth daily. 90 tablet 3   No current facility-administered medications for this visit.     Past Surgical History:  Procedure Laterality Date   CARDIAC CATHETERIZATION     2 stents inserted   CATARACT EXTRACTION W/PHACO Left 05/14/2016    Procedure: CATARACT EXTRACTION PHACO AND INTRAOCULAR LENS PLACEMENT (IOC);  Surgeon: Fabio Pierce, MD;  Location: [facility];  Service: Ophthalmology;  Laterality: Left;  CDE: 12.39   CATARACT EXTRACTION W/PHACO Right 07/16/2016   Procedure: CATARACT EXTRACTION PHACO AND INTRAOCULAR LENS PLACEMENT (IOC);  Surgeon: Fabio Pierce, MD;  Location: [facility];  Service: Ophthalmology;  Laterality: Right;  CDE: 17.59   CIRCUMCISION     SKIN LESION EXCISION Left    left leg and back.     No Known Allergies    No family history on file.   Social History Mr. Pore reports that he has been smoking cigarettes. He started smoking about 56 years ago. He has a 28.4 pack-year smoking history. He has never used smokeless tobacco. Mr. Humble reports current alcohol use of about 5.0 standard drinks of alcohol per week.    Physical Examination Today's Vitals   09/05/23 0838 09/05/23 0857  BP: 136/64 134/60  Pulse: 84   SpO2: 100%   Weight: 137 lb 12.8 oz (62.5 kg)   Height: 5\' 7"  (1.702 m)    Body mass index is 21.58 kg/m.  Gen: resting comfortably, no acute distress HEENT: no scleral icterus, pupils equal  round and reactive, no palptable cervical adenopathy,  CV: RRR, no mrg, no jvd Resp: Clear to auscultation bilaterally GI: abdomen is soft, non-tender, non-distended, normal bowel sounds, no hepatosplenomegaly MSK: extremities are warm, no edema.  Skin: warm, no rash Neuro:  no focal deficits Psych: appropriate affect   Diagnostic Studies  07/2008 Cath RESULTS: Aortic pressure was 115/54 with mean of 85. Left index   pressure was 115/10.   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   Left anterior descending artery: The left anterior descending artery   gave rise to a large diagonal Etoy Mcdonnell, septal perforator, and 2 small   diagonal branches. There was 40% narrowing in the proximal LAD after   the diagonal  Anibal Quinby and there was 50% narrowing in the very proximal and   ostium of the first diagonal Miniya Miguez. There was moderate calcification   and no irregularities.   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 This was a small vessel that was diffusely   diseased with 70-80% stenoses in its proximal to midportion. It filled   2 small marginal branches. This vessel was also significantly diseased   on the prior study.   Right coronary artery: The right coronary artery was a moderately large   vessel that gave rise to a posterior descending Mary Hockey and a large   posterolateral Ashani Pumphrey. There were multiple right angle turns in the   proximal vessel and there was a long stent in the midvessel which   traversed to a right angle bend. There was a 90% focal stenosis just   before the second bend and the midvessel. There was also 70-80%   narrowing in the distal right coronary artery which extended just to the   posterior descending Kevin Mario. There was 60% narrowing in the   posterolateral Makayleigh Poliquin.   Left ventriculogram: The left ventriculogram performed in the RAO   projection showed hypokinesis of the inferobasal segment. The overall   wall motion was good with an estimated ejection fraction of 60%.   Following cutting balloon angioplasty, the lesion within the stent in   the mid-right coronary stenosis improved from 90% to 10%.   Following stenting of the lesion, the distal right coronary stenosis   improved from 80% to 0%..   CONCLUSION:   1. 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   2. Successful percutaneous coronary intervention of the in-stent   restenotic lesion in the mid-right coronary using a cutting balloon   angioplasty with improvement in central narrowing from 90% to 10%.   3. Successful percutaneous coronary intervention of the lesion in the   distal right coronary using a Xience drug-eluting stent with   improvement in central narrowing from 80% to 0%.   DISPOSITION: The patient returned to the [facility] room for further   observation. Recommend Plavix for at least a year.         10/19/13 Clinic EKG NSR  10/2013 AAA US IMPRESSION: No evidence of abdominal aortic aneurysm .       Assessment and Plan   1. CAD - denies any recent symptoms, continue current meds - EKG today shows SR, no ischemic changes   2. HTN - bp essentially at goal, continue current meds   3. Hyperlipidemia - LDL goal would be <55 in setting of two prior MIs. Has trended up over last few labs and now above goal - d/c atorvatatin, start crestor 40mg  daily   F/u 6 months    Antoine Poche, M.D.

## 2023-09-07 ENCOUNTER — Encounter: Payer: Medicare HMO | Admitting: Internal Medicine

## 2023-09-08 ENCOUNTER — Encounter: Payer: Self-pay | Admitting: Internal Medicine

## 2023-09-08 ENCOUNTER — Ambulatory Visit (INDEPENDENT_AMBULATORY_CARE_PROVIDER_SITE_OTHER): Admitting: Internal Medicine

## 2023-09-08 VITALS — BP 134/70 | HR 83 | Ht 67.0 in | Wt 135.4 lb

## 2023-09-08 DIAGNOSIS — M5136 Other intervertebral disc degeneration, lumbar region with discogenic back pain only: Secondary | ICD-10-CM

## 2023-09-08 DIAGNOSIS — E559 Vitamin D deficiency, unspecified: Secondary | ICD-10-CM | POA: Diagnosis not present

## 2023-09-08 DIAGNOSIS — E782 Mixed hyperlipidemia: Secondary | ICD-10-CM | POA: Diagnosis not present

## 2023-09-08 DIAGNOSIS — J309 Allergic rhinitis, unspecified: Secondary | ICD-10-CM | POA: Diagnosis not present

## 2023-09-08 DIAGNOSIS — Z720 Tobacco use: Secondary | ICD-10-CM

## 2023-09-08 DIAGNOSIS — I2510 Atherosclerotic heart disease of native coronary artery without angina pectoris: Secondary | ICD-10-CM

## 2023-09-08 DIAGNOSIS — R7303 Prediabetes: Secondary | ICD-10-CM

## 2023-09-08 DIAGNOSIS — Z125 Encounter for screening for malignant neoplasm of prostate: Secondary | ICD-10-CM

## 2023-09-08 DIAGNOSIS — Z0001 Encounter for general adult medical examination with abnormal findings: Secondary | ICD-10-CM

## 2023-09-08 DIAGNOSIS — I10 Essential (primary) hypertension: Secondary | ICD-10-CM

## 2023-09-08 NOTE — Assessment & Plan Note (Signed)
S/p stent placement in 2010 Denies any chest pain or dyspnea currently On aspirin and statin Followed by Cardiology

## 2023-09-08 NOTE — Assessment & Plan Note (Signed)
 Lab Results  Component Value Date   HGBA1C 5.5 03/10/2023   Advised to follow DASH diet

## 2023-09-08 NOTE — Assessment & Plan Note (Signed)
 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

## 2023-09-08 NOTE — Assessment & Plan Note (Addendum)
 On Crestor 40 mg once daily, recently switched from Lipitor by cardiology Check lipid profile

## 2023-09-08 NOTE — Assessment & Plan Note (Addendum)
 Smokes about 0.5 pack/day  Asked about quitting: confirms that he/she currently smokes cigarettes Advise to quit smoking: Educated about QUITTING to reduce the risk of cancer, cardio and cerebrovascular disease. Assess willingness: Unwilling to quit at this time, but is working on cutting back. Arrange for follow up: follow up and continue to offer help.

## 2023-09-08 NOTE — Assessment & Plan Note (Signed)
Physical exam as documented. Fasting blood tests today. Advised to get Shingrix and Tdap vaccines at local pharmacy. 

## 2023-09-08 NOTE — Assessment & Plan Note (Addendum)
 His symptoms are likely due to allergic sinusitis Continue Allegra for now, has tried Zyrtec and Clarinex Continue to use Flonase regularly

## 2023-09-08 NOTE — Assessment & Plan Note (Signed)
 BP Readings from Last 1 Encounters:  09/08/23 134/70   Well-controlled with Lisinopril and HCTZ Counseled for compliance with the medications Advised DASH diet and moderate exercise/walking as tolerated

## 2023-09-08 NOTE — Progress Notes (Addendum)
 Established Patient Office Visit  Subjective:  Patient ID: [name], male    DOB: [birth year]  Age: 78 y.o. MRN: [id number]  CC:  Chief Complaint  Patient presents with   Annual Exam    cpe    HPI [name] is a 78 y.o. male with past medical history of CAD, HTN, HLD and tobacco abuse who presents for annual physical.  HTN and CAD: BP is well-controlled. Takes medications regularly. Patient denies headache, dizziness, chest pain, dyspnea or palpitations.  He has chronic nasal congestion, postnasal drip and sinus pressure related headache.  He has intermittent dry cough as well.  Denies any fever or chills currently.  He is currently taking Allegra with mild relief.  He has tried Zyrtec, Claritin and Clarinex without much relief.  He had noticed improvement with Flonase.  He smokes about 0.5 pack/day.  He is trying to cut down smoking currently.  Of note, he denies any dyspnea or wheezing currently.  He reports intermittent low back pain, which is dull, nonradiating and worse with bending.  He has tried taking Tylenol with adequate relief.  Denies any recent injury or fall.  Denies any numbness or tingling of the LE.   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    Arthritis    Erectile dysfunction    Gout    acute gouty arthropathy   Hyperlipidemia    Hypertension    Tobacco abuse    60 pack years continuing at one pack per day     Past Surgical History:  Procedure Laterality Date   CARDIAC CATHETERIZATION     2 stents inserted   CATARACT EXTRACTION W/PHACO Left 05/14/2016   Procedure: CATARACT EXTRACTION PHACO AND INTRAOCULAR LENS PLACEMENT (IOC);  Surgeon: Fabio Pierce, MD;  Location: [facility];  Service: Ophthalmology;  Laterality: Left;  CDE: 12.39   CATARACT EXTRACTION W/PHACO Right 07/16/2016   Procedure: CATARACT EXTRACTION PHACO AND INTRAOCULAR LENS PLACEMENT (IOC);  Surgeon: Fabio Pierce, MD;  Location: [facility];  Service: Ophthalmology;  Laterality: Right;  CDE: 17.59   CIRCUMCISION     SKIN LESION EXCISION Left    left leg and back.    History reviewed. No pertinent family history.  Social History   Socioeconomic History   Marital status: Widowed    Spouse name: Not on file   Number of children: 2   Years of education: Not on file   Highest education level: Not on file  Occupational History   Occupation: Retired (5 years as of 02/18/21)-    Comment: worked at [employer] in [employer]  Tobacco Use   Smoking status: Every Day    Current packs/day: 0.50    Average packs/day: 0.5 packs/day for 56.8 years (28.4 ttl pk-yrs)    Types: Cigarettes    Start date: 11/12/1966   Smokeless tobacco: Never  Vaping Use   Vaping status: Never Used  Substance and Sexual Activity   Alcohol use: Yes    Alcohol/week: 5.0 standard drinks of alcohol    Types: 5 Cans of beer per week   Drug use: No   Sexual activity: Not Currently    Birth control/protection: None  Other Topics Concern  Not on file  Social History Narrative   Not on file   Social Drivers of Health   Financial Resource Strain: Low Risk  (05/11/2023)   Overall Financial Resource Strain (CARDIA)    Difficulty of Paying Living Expenses: Not hard at all  Food Insecurity: No Food Insecurity (05/11/2023)   Hunger Vital Sign    Worried About Running Out of Food in the Last Year: Never true    Ran Out of Food in the Last Year: Never true  Transportation Needs: No Transportation Needs (05/11/2023)   PRAPARE - Administrator, Civil Service (Medical): No    Lack of Transportation (Non-Medical): No  Physical Activity: Inactive (05/11/2023)   Exercise Vital Sign    Days of  Exercise per Week: 0 days    Minutes of Exercise per Session: 0 min  Stress: No Stress Concern Present (05/11/2023)   [employer] of Occupational Health - Occupational Stress Questionnaire    Feeling of Stress : Not at all  Social Connections: Socially Isolated (05/11/2023)   Social Connection and Isolation Panel [NHANES]    Frequency of Communication with Friends and Family: Three times a week    Frequency of Social Gatherings with Friends and Family: Once a week    Attends Religious Services: Never    Database administrator or Organizations: No    Attends Banker Meetings: Never    Marital Status: Widowed  Intimate Partner Violence: Not At Risk (05/11/2023)   Humiliation, Afraid, Rape, and Kick questionnaire    Fear of Current or Ex-Partner: No    Emotionally Abused: No    Physically Abused: No    Sexually Abused: No    Outpatient Medications Prior to Visit  Medication Sig Dispense Refill   aspirin 81 MG tablet Take 81 mg by mouth daily.     fluticasone (FLONASE) 50 MCG/ACT nasal spray Place 2 sprays into both nostrils daily. 16 g 6   hydrochlorothiazide (HYDRODIURIL) 25 MG tablet Take 1 tablet (25 mg total) by mouth daily. 90 tablet 3   lisinopril (ZESTRIL) 10 MG tablet Take 1 tablet (10 mg total) by mouth daily. 90 tablet 3   rosuvastatin (CRESTOR) 40 MG tablet Take 1 tablet (40 mg total) by mouth daily. 30 tablet 6   No facility-administered medications prior to visit.    No Known Allergies  ROS Review of Systems  Constitutional:  Positive for fatigue. Negative for chills and fever.  HENT:  Positive for congestion, postnasal drip and sinus pressure. Negative for sore throat.   Eyes:  Negative for pain and discharge.  Respiratory:  Positive for cough. Negative for shortness of breath.   Cardiovascular:  Negative for chest pain and palpitations.  Gastrointestinal:  Negative for constipation, diarrhea, nausea and vomiting.  Endocrine: Negative for  polydipsia and polyuria.  Genitourinary:  Negative for dysuria and hematuria.  Musculoskeletal:  Positive for back pain. Negative for neck pain and neck stiffness.  Skin:  Negative for rash.  Neurological:  Negative for dizziness, weakness, numbness and headaches.  Psychiatric/Behavioral:  Negative for agitation and behavioral problems.       Objective:    Physical Exam Vitals reviewed.  Constitutional:      General: He is not in acute distress.    Appearance: He is not diaphoretic.  HENT:     Head: Normocephalic and atraumatic.     Nose: Congestion present.     Right Sinus: Frontal sinus tenderness present. No maxillary sinus tenderness.  Left Sinus: Frontal sinus tenderness present. No maxillary sinus tenderness.     Mouth/Throat:     Mouth: Mucous membranes are moist.  Eyes:     General: No scleral icterus.    Extraocular Movements: Extraocular movements intact.  Cardiovascular:     Rate and Rhythm: Normal rate and regular rhythm.     Heart sounds: Normal heart sounds. No murmur heard. Pulmonary:     Breath sounds: Normal breath sounds. No wheezing or rales.  Abdominal:     Palpations: Abdomen is soft.     Tenderness: There is no abdominal tenderness.  Musculoskeletal:     Cervical back: Neck supple. No tenderness.     Lumbar back: Tenderness present. Normal range of motion. Negative right straight leg raise test and negative left straight leg raise test.     Right lower leg: No edema.     Left lower leg: No edema.  Skin:    General: Skin is warm.     Findings: No rash.  Neurological:     General: No focal deficit present.     Mental Status: He is alert and oriented to person, place, and time.     Sensory: No sensory deficit.     Motor: No weakness.  Psychiatric:        Mood and Affect: Mood normal.        Behavior: Behavior normal.     BP 134/70 (BP Location: Left Arm)   Pulse 83   Ht 5\' 7"  (1.702 m)   Wt 135 lb 6.4 oz (61.4 kg)   SpO2 98%   BMI 21.21  kg/m  Wt Readings from Last 3 Encounters:  09/08/23 135 lb 6.4 oz (61.4 kg)  09/05/23 137 lb 12.8 oz (62.5 kg)  05/11/23 139 lb (63 kg)    Lab Results  Component Value Date   TSH 0.692 03/10/2023   Lab Results  Component Value Date   WBC 6.6 03/10/2023   HGB 14.4 03/10/2023   HCT 45.2 03/10/2023   MCV 99 (H) 03/10/2023   PLT 210 03/10/2023   Lab Results  Component Value Date   NA 141 03/10/2023   K 5.1 03/10/2023   CO2 23 03/10/2023   GLUCOSE 87 03/10/2023   BUN 11 03/10/2023   CREATININE 0.84 03/10/2023   BILITOT 0.5 03/10/2023   ALKPHOS 57 03/10/2023   AST 22 03/10/2023   ALT 23 03/10/2023   PROT 7.1 03/10/2023   ALBUMIN 4.5 03/10/2023   CALCIUM 9.1 03/10/2023   ANIONGAP 8 05/12/2016   EGFR 91 03/10/2023   Lab Results  Component Value Date   CHOL 140 03/10/2023   Lab Results  Component Value Date   HDL 51 03/10/2023   Lab Results  Component Value Date   LDLCALC 62 03/10/2023   Lab Results  Component Value Date   TRIG 158 (H) 03/10/2023   Lab Results  Component Value Date   CHOLHDL 2.7 03/10/2023   Lab Results  Component Value Date   HGBA1C 5.5 03/10/2023      Assessment & Plan:   Problem List Items Addressed This Visit       Cardiovascular and Mediastinum   Hypertension   BP Readings from Last 1 Encounters:  09/08/23 134/70   Well-controlled with Lisinopril and HCTZ Counseled for compliance with the medications Advised DASH diet and moderate exercise/walking as tolerated      Relevant Orders   TSH   CMP14+EGFR   CBC with Differential/Platelet   Coronary artery  disease involving native coronary artery of native heart without angina pectoris   S/p stent placement in 2010 Denies any chest pain or dyspnea currently On aspirin and statin Followed by Cardiology      Relevant Orders   TSH     Respiratory   Allergic sinusitis   His symptoms are likely due to allergic sinusitis Continue Allegra for now, has tried Zyrtec and  Clarinex Continue to use Flonase regularly        Musculoskeletal and Integument   Degeneration of intervertebral disc of lumbar region with discogenic back pain   Mild, chronic low back pain Likely due to DDD of lumbar spine Simple back exercises advised Tylenol arthritis as needed for pain Avoid heavy lifting and frequent bending If more frequent pain, will get x-ray of lumbar spine        Other   Tobacco abuse   Smokes about 0.5 pack/day  Asked about quitting: confirms that he/she currently smokes cigarettes Advise to quit smoking: Educated about QUITTING to reduce the risk of cancer, cardio and cerebrovascular disease. Assess willingness: Unwilling to quit at this time, but is working on cutting back. Arrange for follow up: follow up and continue to offer help.      Hyperlipidemia   On Crestor 40 mg once daily, recently switched from Lipitor by cardiology Check lipid profile      Relevant Orders   Lipid panel   Prostate cancer screening   Relevant Orders   PSA   Encounter for general adult medical examination with abnormal findings - Primary   Physical exam as documented. Fasting blood tests today. Advised to get Shingrix and Tdap vaccines at local pharmacy.      Prediabetes   Lab Results  Component Value Date   HGBA1C 5.5 03/10/2023   Advised to follow DASH diet      Relevant Orders   VITAMIN D 25 Hydroxy (Vit-D Deficiency, Fractures)   Other Visit Diagnoses       Vitamin D deficiency       Relevant Orders   Hemoglobin A1c       No orders of the defined types were placed in this encounter.   Follow-up: Return in about 6 months (around 03/10/2024) for HTN and HLD.    Anabel Halon, MD

## 2023-09-08 NOTE — Patient Instructions (Addendum)
 Please take Tylenol arthritis as needed for back pain.  Please perform simple back stretching. Okay to use back brace and/or heating pad for back pain.  Please continue to take medications as prescribed.  Please continue to follow low salt diet and perform moderate exercise/walking as tolerated.  Please try to cut down -> quit smoking.  Please get fasting blood tests done before the next visit.

## 2023-10-26 ENCOUNTER — Other Ambulatory Visit: Payer: Self-pay | Admitting: Internal Medicine

## 2023-10-26 DIAGNOSIS — I10 Essential (primary) hypertension: Secondary | ICD-10-CM

## 2024-02-26 ENCOUNTER — Other Ambulatory Visit: Payer: Self-pay | Admitting: Internal Medicine

## 2024-02-26 DIAGNOSIS — Z9109 Other allergy status, other than to drugs and biological substances: Secondary | ICD-10-CM

## 2024-03-12 ENCOUNTER — Ambulatory Visit (INDEPENDENT_AMBULATORY_CARE_PROVIDER_SITE_OTHER): Admitting: Internal Medicine

## 2024-03-12 ENCOUNTER — Encounter: Payer: Self-pay | Admitting: Internal Medicine

## 2024-03-12 VITALS — Ht 67.0 in

## 2024-03-12 DIAGNOSIS — E559 Vitamin D deficiency, unspecified: Secondary | ICD-10-CM | POA: Diagnosis not present

## 2024-03-12 DIAGNOSIS — Z23 Encounter for immunization: Secondary | ICD-10-CM

## 2024-03-12 DIAGNOSIS — I10 Essential (primary) hypertension: Secondary | ICD-10-CM | POA: Diagnosis not present

## 2024-03-12 DIAGNOSIS — M5136 Other intervertebral disc degeneration, lumbar region with discogenic back pain only: Secondary | ICD-10-CM | POA: Diagnosis not present

## 2024-03-12 DIAGNOSIS — J0111 Acute recurrent frontal sinusitis: Secondary | ICD-10-CM | POA: Insufficient documentation

## 2024-03-12 DIAGNOSIS — E782 Mixed hyperlipidemia: Secondary | ICD-10-CM

## 2024-03-12 DIAGNOSIS — R7303 Prediabetes: Secondary | ICD-10-CM | POA: Diagnosis not present

## 2024-03-12 DIAGNOSIS — Z125 Encounter for screening for malignant neoplasm of prostate: Secondary | ICD-10-CM | POA: Diagnosis not present

## 2024-03-12 DIAGNOSIS — I2510 Atherosclerotic heart disease of native coronary artery without angina pectoris: Secondary | ICD-10-CM

## 2024-03-12 MED ORDER — AZITHROMYCIN 250 MG PO TABS: ORAL_TABLET | ORAL | 0 refills | Status: AC

## 2024-03-12 NOTE — Assessment & Plan Note (Deleted)
 His symptoms are likely due to allergic sinusitis Continue Allegra for now, has tried Zyrtec and Clarinex Continue to use Flonase regularly

## 2024-03-12 NOTE — Progress Notes (Signed)
 Established Patient Office Visit  Subjective:  Patient ID: [name], male    DOB: Sep 12, 1945  Age: 78 y.o. MRN: [id number]  CC:  Chief Complaint  Patient presents with   Hypertension    6 month f/u    Hyperlipidemia    6 month f/u    Sinus Problem    Reports irritation in throat due to weather changes.     HPI [name] is a 78 y.o. male with past medical history of CAD, HTN, HLD and tobacco abuse who presents for f/u of his chronic medical conditions.  HTN and CAD: BP is well-controlled. Takes medications regularly. Patient denies headache, dizziness, chest pain, dyspnea or palpitations.  He has chronic nasal congestion, postnasal drip and sinus pressure related headache, but worse for the last 2 weeks.  He has intermittent dry cough as well.  Denies any fever or chills currently.  He is currently taking Allegra with mild relief.  He has tried Zyrtec , Claritin and Clarinex  without much relief.  He had noticed mild improvement with Flonase .  He smokes about 0.5 pack/day.  He is trying to cut down smoking currently.  Of note, he denies any dyspnea or wheezing currently.  He reports intermittent low back pain, which is dull, nonradiating and worse with bending.  He has tried taking Tylenol with adequate relief.  Denies any recent injury or fall.  Denies any numbness or tingling of the LE.   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    Arthritis    Erectile dysfunction    Gout    acute gouty arthropathy   Hyperlipidemia    Hypertension    Tobacco abuse    60 pack years continuing at one pack per day     Past Surgical History:  Procedure Laterality Date   CARDIAC  CATHETERIZATION     2 stents inserted   CATARACT EXTRACTION W/PHACO Left 05/14/2016   Procedure: CATARACT EXTRACTION PHACO AND INTRAOCULAR LENS PLACEMENT (IOC);  Surgeon: Lynwood Hermann, MD;  Location: [facility];  Service: Ophthalmology;  Laterality: Left;  CDE: 12.39   CATARACT EXTRACTION W/PHACO Right 07/16/2016   Procedure: CATARACT EXTRACTION PHACO AND INTRAOCULAR LENS PLACEMENT (IOC);  Surgeon: Lynwood Hermann, MD;  Location: [facility];  Service: Ophthalmology;  Laterality: Right;  CDE: 17.59   CIRCUMCISION     SKIN LESION EXCISION Left    left leg and back.    History reviewed. No pertinent family history.  Social History   Socioeconomic History   Marital status: Widowed    Spouse name: Not on file   Number of children: 2   Years of education: Not on file   Highest education level: Not on file  Occupational History   Occupation: Retired (5 years as of 02/18/21)-    Comment: worked at [employer] in [employer]  Tobacco Use   Smoking status: Every Day    Current packs/day: 0.50    Average packs/day: 0.5 packs/day for 57.3 years (28.7 ttl pk-yrs)    Types: Cigarettes    Start date: 11/12/1966   Smokeless tobacco: Never  Vaping Use   Vaping status: Never Used  Substance and Sexual Activity   Alcohol use: Yes  Alcohol/week: 5.0 standard drinks of alcohol    Types: 5 Cans of beer per week   Drug use: No   Sexual activity: Not Currently    Birth control/protection: None  Other Topics Concern   Not on file  Social History Narrative   Not on file   Social Drivers of Health   Financial Resource Strain: Low Risk  (05/11/2023)   Overall Financial Resource Strain (CARDIA)    Difficulty of Paying Living Expenses: Not hard at all  Food Insecurity: No Food Insecurity (05/11/2023)   Hunger Vital Sign    Worried About Running Out of Food in the Last Year: Never true    Ran Out of Food in the Last Year: Never true  Transportation Needs: No Transportation Needs (05/11/2023)   PRAPARE -  Administrator, Civil Service (Medical): No    Lack of Transportation (Non-Medical): No  Physical Activity: Inactive (05/11/2023)   Exercise Vital Sign    Days of Exercise per Week: 0 days    Minutes of Exercise per Session: 0 min  Stress: No Stress Concern Present (05/11/2023)   [employer] of Occupational Health - Occupational Stress Questionnaire    Feeling of Stress : Not at all  Social Connections: Socially Isolated (05/11/2023)   Social Connection and Isolation Panel    Frequency of Communication with Friends and Family: Three times a week    Frequency of Social Gatherings with Friends and Family: Once a week    Attends Religious Services: Never    Database administrator or Organizations: No    Attends Banker Meetings: Never    Marital Status: Widowed  Intimate Partner Violence: Not At Risk (05/11/2023)   Humiliation, Afraid, Rape, and Kick questionnaire    Fear of Current or Ex-Partner: No    Emotionally Abused: No    Physically Abused: No    Sexually Abused: No    Outpatient Medications Prior to Visit  Medication Sig Dispense Refill   aspirin 81 MG tablet Take 81 mg by mouth daily.     fluticasone  (FLONASE ) 50 MCG/ACT nasal spray Place 2 sprays into both nostrils daily. 16 g 6   hydrochlorothiazide  (HYDRODIURIL ) 25 MG tablet TAKE 1 TABLET (25 MG TOTAL) BY MOUTH DAILY. 90 tablet 3   lisinopril  (ZESTRIL ) 10 MG tablet TAKE 1 TABLET (10 MG TOTAL) BY MOUTH DAILY. 90 tablet 3   rosuvastatin  (CRESTOR ) 40 MG tablet Take 1 tablet (40 mg total) by mouth daily. 30 tablet 6   No facility-administered medications prior to visit.    No Known Allergies  ROS Review of Systems  Constitutional:  Positive for fatigue. Negative for chills and fever.  HENT:  Positive for congestion, postnasal drip and sinus pressure. Negative for sore throat.   Eyes:  Negative for pain and discharge.  Respiratory:  Positive for cough. Negative for shortness of breath.    Cardiovascular:  Negative for chest pain and palpitations.  Gastrointestinal:  Negative for diarrhea, nausea and vomiting.  Endocrine: Negative for polydipsia and polyuria.  Genitourinary:  Negative for dysuria and hematuria.  Musculoskeletal:  Positive for back pain. Negative for neck pain and neck stiffness.  Skin:  Negative for rash.  Neurological:  Negative for dizziness, weakness, numbness and headaches.  Psychiatric/Behavioral:  Negative for agitation and behavioral problems.       Objective:    Physical Exam Vitals reviewed.  Constitutional:      General: He is not in acute distress.  Appearance: He is not diaphoretic.  HENT:     Head: Normocephalic and atraumatic.     Nose: Congestion present.     Right Sinus: Frontal sinus tenderness present. No maxillary sinus tenderness.     Left Sinus: Frontal sinus tenderness present. No maxillary sinus tenderness.     Mouth/Throat:     Mouth: Mucous membranes are moist.  Eyes:     General: No scleral icterus.    Extraocular Movements: Extraocular movements intact.  Cardiovascular:     Rate and Rhythm: Normal rate and regular rhythm.     Heart sounds: Normal heart sounds. No murmur heard. Pulmonary:     Breath sounds: Normal breath sounds. No wheezing or rales.  Abdominal:     Palpations: Abdomen is soft.     Tenderness: There is no abdominal tenderness.  Musculoskeletal:     Cervical back: Neck supple. No tenderness.     Lumbar back: Tenderness present. Normal range of motion. Negative right straight leg raise test and negative left straight leg raise test.     Right lower leg: No edema.     Left lower leg: No edema.  Skin:    General: Skin is warm.     Findings: No rash.  Neurological:     General: No focal deficit present.     Mental Status: He is alert and oriented to person, place, and time.     Sensory: No sensory deficit.     Motor: No weakness.  Psychiatric:        Mood and Affect: Mood normal.         Behavior: Behavior normal.     Ht 5' 7 (1.702 m)   BMI 21.21 kg/m  Wt Readings from Last 3 Encounters:  09/08/23 135 lb 6.4 oz (61.4 kg)  09/05/23 137 lb 12.8 oz (62.5 kg)  05/11/23 139 lb (63 kg)    Lab Results  Component Value Date   TSH 0.692 03/10/2023   Lab Results  Component Value Date   WBC 6.6 03/10/2023   HGB 14.4 03/10/2023   HCT 45.2 03/10/2023   MCV 99 (H) 03/10/2023   PLT 210 03/10/2023   Lab Results  Component Value Date   NA 141 03/10/2023   K 5.1 03/10/2023   CO2 23 03/10/2023   GLUCOSE 87 03/10/2023   BUN 11 03/10/2023   CREATININE 0.84 03/10/2023   BILITOT 0.5 03/10/2023   ALKPHOS 57 03/10/2023   AST 22 03/10/2023   ALT 23 03/10/2023   PROT 7.1 03/10/2023   ALBUMIN 4.5 03/10/2023   CALCIUM  9.1 03/10/2023   ANIONGAP 8 05/12/2016   EGFR 91 03/10/2023   Lab Results  Component Value Date   CHOL 140 03/10/2023   Lab Results  Component Value Date   HDL 51 03/10/2023   Lab Results  Component Value Date   LDLCALC 62 03/10/2023   Lab Results  Component Value Date   TRIG 158 (H) 03/10/2023   Lab Results  Component Value Date   CHOLHDL 2.7 03/10/2023   Lab Results  Component Value Date   HGBA1C 5.5 03/10/2023      Assessment & Plan:   Problem List Items Addressed This Visit       Cardiovascular and Mediastinum   Hypertension - Primary   BP Readings from Last 1 Encounters:  09/08/23 134/70   Well-controlled with Lisinopril  and HCTZ Counseled for compliance with the medications Advised DASH diet and moderate exercise/walking as tolerated  Coronary artery disease involving native coronary artery of native heart without angina pectoris   S/p stent placement in 2010 Denies any chest pain or dyspnea currently On aspirin and statin Followed by Cardiology        Respiratory   Acute recurrent frontal sinusitis   Started empiric azithromycin as he has persistent symptoms despite symptomatic treatment Continue Flonase  for  nasal congestion/allergies Continue Allegra for allergies Use sinus inhaler and/or vaporizer for nasal congestion      Relevant Medications   azithromycin (ZITHROMAX) 250 MG tablet     Musculoskeletal and Integument   Degeneration of intervertebral disc of lumbar region with discogenic back pain   Mild, chronic low back pain Likely due to DDD of lumbar spine Simple back exercises advised Tylenol arthritis as needed for pain Avoid heavy lifting and frequent bending If more frequent pain, will get x-ray of lumbar spine        Other   Hyperlipidemia   On Crestor  40 mg QD Check lipid profile      Prediabetes   Lab Results  Component Value Date   HGBA1C 5.5 03/10/2023   Advised to follow DASH diet      Other Visit Diagnoses       Encounter for immunization       Relevant Orders   Flu vaccine HIGH DOSE PF(Fluzone Trivalent) (Completed)        Meds ordered this encounter  Medications   azithromycin (ZITHROMAX) 250 MG tablet    Sig: Take 2 tablets on day 1, then 1 tablet daily on days 2 through 5    Dispense:  6 tablet    Refill:  0    Follow-up: Return in about 6 months (around 09/09/2024) for HTN and HLD.    Suzzane MARLA Blanch, MD

## 2024-03-12 NOTE — Assessment & Plan Note (Addendum)
 Started empiric azithromycin as he has persistent symptoms despite symptomatic treatment Continue Flonase  for nasal congestion/allergies Continue Allegra for allergies Use sinus inhaler and/or vaporizer for nasal congestion

## 2024-03-12 NOTE — Assessment & Plan Note (Signed)
 Lab Results  Component Value Date   HGBA1C 5.5 03/10/2023   Advised to follow DASH diet

## 2024-03-12 NOTE — Assessment & Plan Note (Signed)
 BP Readings from Last 1 Encounters:  09/08/23 134/70   Well-controlled with Lisinopril and HCTZ Counseled for compliance with the medications Advised DASH diet and moderate exercise/walking as tolerated

## 2024-03-12 NOTE — Assessment & Plan Note (Signed)
S/p stent placement in 2010 Denies any chest pain or dyspnea currently On aspirin and statin Followed by Cardiology

## 2024-03-12 NOTE — Assessment & Plan Note (Signed)
 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

## 2024-03-12 NOTE — Assessment & Plan Note (Signed)
On Crestor 40 mg QD Check lipid profile

## 2024-03-12 NOTE — Patient Instructions (Signed)
 Please start taking Azithromycin as prescribed. Please use Flonase  for nasal congestion/allergies.  Please continue to take medications as prescribed.  Please continue to follow low salt diet and perform moderate exercise/walking as tolerated.  Please try to cut down -> quit smoking.

## 2024-03-13 ENCOUNTER — Ambulatory Visit: Payer: Self-pay | Admitting: Internal Medicine

## 2024-03-13 LAB — VITAMIN D 25 HYDROXY (VIT D DEFICIENCY, FRACTURES): Vit D, 25-Hydroxy: 13.3 ng/mL — ABNORMAL LOW (ref 30.0–100.0)

## 2024-03-13 LAB — CBC WITH DIFFERENTIAL/PLATELET
Basophils Absolute: 0.1 x10E3/uL (ref 0.0–0.2)
Basos: 1 %
EOS (ABSOLUTE): 0.3 x10E3/uL (ref 0.0–0.4)
Eos: 4 %
Hematocrit: 43.2 % (ref 37.5–51.0)
Hemoglobin: 14.1 g/dL (ref 13.0–17.7)
Immature Grans (Abs): 0 x10E3/uL (ref 0.0–0.1)
Immature Granulocytes: 0 %
Lymphocytes Absolute: 1.7 x10E3/uL (ref 0.7–3.1)
Lymphs: 28 %
MCH: 31.8 pg (ref 26.6–33.0)
MCHC: 32.6 g/dL (ref 31.5–35.7)
MCV: 98 fL — ABNORMAL HIGH (ref 79–97)
Monocytes Absolute: 0.5 x10E3/uL (ref 0.1–0.9)
Monocytes: 9 %
Neutrophils Absolute: 3.3 x10E3/uL (ref 1.4–7.0)
Neutrophils: 58 %
Platelets: 222 x10E3/uL (ref 150–450)
RBC: 4.43 x10E6/uL (ref 4.14–5.80)
RDW: 13.2 % (ref 11.6–15.4)
WBC: 5.9 x10E3/uL (ref 3.4–10.8)

## 2024-03-13 LAB — LIPID PANEL
Chol/HDL Ratio: 2.1 ratio (ref 0.0–5.0)
Cholesterol, Total: 122 mg/dL (ref 100–199)
HDL: 57 mg/dL (ref >39)
LDL Chol Calc (NIH): 42 mg/dL (ref 0–99)
Triglycerides: 131 mg/dL (ref 0–149)
VLDL Cholesterol Cal: 23 mg/dL (ref 5–40)

## 2024-03-13 LAB — CMP14+EGFR
ALT: 26 IU/L (ref 0–44)
AST: 25 IU/L (ref 0–40)
Albumin: 4.5 g/dL (ref 3.8–4.8)
Alkaline Phosphatase: 61 IU/L (ref 47–123)
BUN/Creatinine Ratio: 13 (ref 10–24)
BUN: 10 mg/dL (ref 8–27)
Bilirubin Total: 0.4 mg/dL (ref 0.0–1.2)
CO2: 21 mmol/L (ref 20–29)
Calcium: 9.2 mg/dL (ref 8.6–10.2)
Chloride: 97 mmol/L (ref 96–106)
Creatinine, Ser: 0.76 mg/dL (ref 0.76–1.27)
Globulin, Total: 2.5 g/dL (ref 1.5–4.5)
Glucose: 96 mg/dL (ref 70–99)
Potassium: 4.5 mmol/L (ref 3.5–5.2)
Sodium: 134 mmol/L (ref 134–144)
Total Protein: 7 g/dL (ref 6.0–8.5)
eGFR: 93 mL/min/1.73 (ref >59)

## 2024-03-13 LAB — HEMOGLOBIN A1C
Est. average glucose Bld gHb Est-mCnc: 111 mg/dL
Hgb A1c MFr Bld: 5.5 % (ref 4.8–5.6)

## 2024-03-13 LAB — TSH: TSH: 0.535 u[IU]/mL (ref 0.450–4.500)

## 2024-03-13 LAB — PSA: Prostate Specific Ag, Serum: 1 ng/mL (ref 0.0–4.0)

## 2024-04-05 ENCOUNTER — Ambulatory Visit: Attending: Cardiology | Admitting: Cardiology

## 2024-04-05 ENCOUNTER — Encounter: Payer: Self-pay | Admitting: Cardiology

## 2024-04-05 VITALS — BP 130/65 | HR 91 | Ht 67.0 in | Wt 140.0 lb

## 2024-04-05 DIAGNOSIS — I2510 Atherosclerotic heart disease of native coronary artery without angina pectoris: Secondary | ICD-10-CM | POA: Diagnosis not present

## 2024-04-05 DIAGNOSIS — Z9109 Other allergy status, other than to drugs and biological substances: Secondary | ICD-10-CM

## 2024-04-05 DIAGNOSIS — E782 Mixed hyperlipidemia: Secondary | ICD-10-CM | POA: Diagnosis not present

## 2024-04-05 DIAGNOSIS — I10 Essential (primary) hypertension: Secondary | ICD-10-CM | POA: Diagnosis not present

## 2024-04-05 MED ORDER — ROSUVASTATIN CALCIUM 40 MG PO TABS: 40.0000 mg | ORAL_TABLET | Freq: Every day | ORAL | 3 refills | Status: AC

## 2024-04-05 MED ORDER — FLUTICASONE PROPIONATE 50 MCG/ACT NA SUSP: 2.0000 | Freq: Every day | NASAL | 0 refills | Status: DC

## 2024-04-05 NOTE — Progress Notes (Signed)
 Clinical Summary [name] is a 78 y.o.male seen today for follow up of the following medical problems.      1. CAD - prior inferior MI in 2008, received BMS to RCA - NSTEMI 07/2008 due to RCA ISR, treated with cutting balloon and additional DES. LVEF 60% by LV gram at that time.   - no chest pain, no SOB/DOE - compliant with meds   2. HTN - compliant with meds     3. 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     fluticasone  (FLONASE ) 50 MCG/ACT nasal spray Place 2 sprays into both nostrils daily. 16 g 6   hydrochlorothiazide  (HYDRODIURIL ) 25 MG tablet TAKE 1 TABLET (25 MG TOTAL) BY MOUTH DAILY. 90 tablet 3   lisinopril  (ZESTRIL ) 10 MG tablet TAKE 1 TABLET (10 MG TOTAL) BY MOUTH DAILY. 90 tablet 3   rosuvastatin  (CRESTOR ) 40 MG tablet Take 1 tablet (40 mg total) by mouth daily. 30 tablet 6   No current facility-administered medications for this visit.     Past Surgical History:  Procedure Laterality Date   CARDIAC CATHETERIZATION     2 stents inserted    CATARACT EXTRACTION W/PHACO Left 05/14/2016   Procedure: CATARACT EXTRACTION PHACO AND INTRAOCULAR LENS PLACEMENT (IOC);  Surgeon: Lynwood Hermann, MD;  Location: [facility];  Service: Ophthalmology;  Laterality: Left;  CDE: 12.39   CATARACT EXTRACTION W/PHACO Right 07/16/2016   Procedure: CATARACT EXTRACTION PHACO AND INTRAOCULAR LENS PLACEMENT (IOC);  Surgeon: Lynwood Hermann, MD;  Location: [facility];  Service: Ophthalmology;  Laterality: Right;  CDE: 17.59   CIRCUMCISION     SKIN LESION EXCISION Left    left leg and back.     No Known Allergies    No family history on file.   Social History [name] reports that he has been smoking cigarettes. He started smoking about 57 years ago. He has a 28.7 pack-year smoking history. He has never used smokeless tobacco. [name] reports current alcohol use of about 5.0 standard drinks of alcohol per week.     Physical Examination Today's Vitals   04/05/24 0818 04/05/24 0834  BP: (!) 144/62 130/65  Pulse: 91   SpO2: 100%   Weight: 140 lb (63.5 kg)   Height: 5' 7 (1.702 m)    Body mass index is 21.93 kg/m.  Gen: resting  comfortably, no acute distress HEENT: no scleral icterus, pupils equal round and reactive, no palptable cervical adenopathy,  CVL RRR, no mr,g no jvd Resp: Clear to auscultation bilaterally GI: abdomen is soft, non-tender, non-distended, normal bowel sounds, no hepatosplenomegaly MSK: extremities are warm, no edema.  Skin: warm, no rash Neuro:  no focal deficits Psych: appropriate affect   Diagnostic Studies  07/2008 Cath RESULTS: Aortic pressure was 115/54 with mean of 85. Left index   pressure was 115/10.   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   Left anterior descending artery: The left anterior descending artery   gave rise to a large diagonal [name], septal perforator, and 2 small   diagonal branches. There was 40% narrowing  in the proximal LAD after   the diagonal [name] and there was 50% narrowing in the very proximal and   ostium of the first diagonal [name]. There was moderate calcification   and no irregularities.   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 This was a small vessel that was diffusely   diseased with 70-80% stenoses in its proximal to midportion. It filled   2 small marginal branches. This vessel was also significantly diseased   on the prior study.   Right coronary artery: The right coronary artery was a moderately large   vessel that gave rise to a posterior descending [name] and a large   posterolateral [name]. There were multiple right angle turns in the   proximal vessel and there was a long stent in the midvessel which   traversed to a right angle bend. There was a 90% focal stenosis just   before the second bend and the midvessel. There was also 70-80%   narrowing in the distal right coronary artery which extended just to the   posterior descending [name]. There was 60% narrowing in the   posterolateral [name].   Left ventriculogram: The left ventriculogram performed in the RAO   projection showed hypokinesis of the inferobasal segment. The overall   wall motion was good with an estimated ejection fraction of 60%.   Following cutting balloon angioplasty, the lesion within the stent in   the mid-right coronary stenosis improved from 90% to 10%.   Following stenting of the lesion, the distal right coronary stenosis   improved from 80% to 0%..   CONCLUSION:   1. 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   2. Successful percutaneous coronary intervention of the in-stent   restenotic lesion in the mid-right coronary using a cutting balloon   angioplasty with improvement in central narrowing from 90% to 10%.   3. Successful percutaneous coronary intervention of the lesion in the   distal right coronary using a Xience drug-eluting stent with   improvement in central narrowing from 80% to 0%.   DISPOSITION: The patient returned to the [facility] room for further   observation. Recommend Plavix for at least a year.  10/19/13 Clinic EKG NSR   10/2013 AAA US  IMPRESSION: No evidence of abdominal aortic aneurysm .           Assessment and Plan   1. CAD - no symptoms, continue current meds   2. HTN - bp at goal, continue current meds   3. Hyperlipidemia - LDL goal would be <55 in setting of two prior MIs.  - most recent labs show he is at goal, continue crestor  40mg  daily   F/u 6 months    Dorn PHEBE Ross, M.D.

## 2024-04-05 NOTE — Patient Instructions (Signed)
 Medication Instructions:  Continue all current medications.   Labwork: none  Testing/Procedures: none  Follow-Up: 6 months   Any Other Special Instructions Will Be Listed Below (If Applicable).   If you need a refill on your cardiac medications before your next appointment, please call your pharmacy.

## 2024-05-14 ENCOUNTER — Ambulatory Visit: Payer: Medicare HMO

## 2024-05-14 VITALS — Ht 67.0 in | Wt 140.0 lb

## 2024-05-14 DIAGNOSIS — Z5320 Procedure and treatment not carried out because of patient's decision for unspecified reasons: Secondary | ICD-10-CM

## 2024-05-14 DIAGNOSIS — Z0000 Encounter for general adult medical examination without abnormal findings: Secondary | ICD-10-CM | POA: Diagnosis not present

## 2024-05-14 NOTE — Progress Notes (Addendum)
 I connected with  [name] on 05/14/24 by a audio enabled telemedicine application and verified that I am speaking with the correct person using two identifiers.  Patient Location: Home  Provider Location: Office/Clinic  Persons Participating in Visit: Patient.  I discussed the limitations of evaluation and management by telemedicine. The patient expressed understanding and agreed to proceed.   Vital Signs: Because this visit was a virtual/telehealth visit, some criteria may be missing or patient reported. Any vitals not documented were not able to be obtained and vitals that have been documented are patient reported.     Chief Complaint  Patient presents with   Medicare Wellness     Subjective:   [name] is a 78 y.o. male who presents for a Medicare Annual Wellness Visit.  Allergies (verified) Patient has no known allergies.   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    Arthritis    Erectile dysfunction    Gout    acute gouty arthropathy   Hyperlipidemia    Hypertension    Tobacco abuse    60 pack years continuing at one pack per day    Past Surgical History:  Procedure Laterality Date   CARDIAC CATHETERIZATION     2 stents inserted   CATARACT EXTRACTION W/PHACO Left 05/14/2016   Procedure: CATARACT EXTRACTION PHACO AND INTRAOCULAR LENS PLACEMENT (IOC);  Surgeon: Lynwood Hermann, MD;  Location: [facility];  Service: Ophthalmology;  Laterality: Left;  CDE: 12.39   CATARACT EXTRACTION W/PHACO Right 07/16/2016   Procedure: CATARACT EXTRACTION PHACO AND INTRAOCULAR LENS PLACEMENT (IOC);  Surgeon: Lynwood Hermann, MD;  Location: [facility];  Service: Ophthalmology;   Laterality: Right;  CDE: 17.59   CIRCUMCISION     SKIN LESION EXCISION Left    left leg and back.   History reviewed. No pertinent family history. Social History   Occupational History   Occupation: Retired (5 years as of 02/18/21)-    Comment: worked at [employer] in [employer]  Tobacco Use   Smoking status: Every Day    Current packs/day: 0.50    Average packs/day: 0.5 packs/day for 57.5 years (28.8 ttl pk-yrs)    Types: Cigarettes    Start date: 11/12/1966   Smokeless tobacco: Never  Vaping Use   Vaping status: Never Used  Substance and Sexual Activity   Alcohol use: Yes    Alcohol/week: 5.0 standard drinks of alcohol    Types: 5 Cans of beer per week   Drug use: No   Sexual activity: Not Currently    Birth control/protection: None   Tobacco Counseling Ready to quit: No Counseling given: Yes  SDOH Screenings   Food Insecurity: No Food Insecurity (05/14/2024)  Housing: Low Risk  (05/14/2024)  Transportation Needs: No Transportation Needs (05/14/2024)  Utilities: Not At Risk (05/14/2024)  Alcohol Screen: Low Risk  (05/11/2023)  Depression (PHQ2-9): Low Risk  (05/14/2024)  Financial Resource Strain: Low Risk  (05/11/2023)  Physical Activity: Sufficiently Active (05/14/2024)  Social Connections: Unknown (05/14/2024)  Stress: No Stress Concern Present (05/14/2024)  Tobacco Use: High Risk (05/14/2024)  Health Literacy: Adequate Health Literacy (05/14/2024)   See flowsheets for full screening details  Depression Screen PHQ 2 & 9  Depression Scale- Over the past 2 weeks, how often have you been bothered by any of the following problems? Little interest or pleasure in doing things: 0 Feeling down, depressed, or hopeless (PHQ Adolescent also includes...irritable): 0 PHQ-2 Total Score: 0 Trouble falling or staying asleep, or sleeping too much: 0 Feeling tired or having little energy: 0 Poor appetite or overeating (PHQ Adolescent also includes...weight loss): 0 Feeling bad about  yourself - or that you are a failure or have let yourself or your family down: 0 Trouble concentrating on things, such as reading the newspaper or watching television (PHQ Adolescent also includes...like school work): 0 Moving or speaking so slowly that other people could have noticed. Or the opposite - being so fidgety or restless that you have been moving around a lot more than usual: 0 Thoughts that you would be better off dead, or of hurting yourself in some way: 0 PHQ-9 Total Score: 0 If you checked off any problems, how difficult have these problems made it for you to do your work, take care of things at home, or get along with other people?: Not difficult at all  Depression Treatment Depression Interventions/Treatment : EYV7-0 Score <4 Follow-up Not Indicated     Goals Addressed             This Visit's Progress    Patient Stated   On track    To take it one day at a time.       Visit info / Clinical Intake: Medicare Wellness Visit Type:: Subsequent Annual Wellness Visit Persons participating in visit:: patient Medicare Wellness Visit Mode:: Telephone If telephone:: video declined Because this visit was a virtual/telehealth visit:: pt reported vitals If Telephone or Video please confirm:: I connected with the patient using audio/video enabled telemedicine application and verified that I am speaking with the correct person using two identifiers; I discussed the limitations of evaluation and management by telemedicine; The patient expressed understanding and agreed to proceed Patient Location:: home Provider Location:: office Information given by:: patient Interpreter Needed?: No Pre-visit prep was completed: yes AWV questionnaire completed by patient prior to visit?: no Living arrangements:: (!) lives alone Patient's Overall Health Status Rating: excellent Typical amount of pain: none Does pain affect daily life?: no Are you currently prescribed opioids?: no  Dietary  Habits and Nutritional Risks How many meals a day?: 2 Eats fruit and vegetables daily?: yes Most meals are obtained by: preparing own meals In the last 2 weeks, have you had any of the following?: none Diabetic:: no  Functional Status Activities of Daily Living (to include ambulation/medication): Independent Ambulation: Independent Medication Administration: Independent Home Management: Independent Manage your own finances?: yes Primary transportation is: driving Concerns about vision?: no *vision screening is required for WTM* Concerns about hearing?: no  Fall Screening Falls in the past year?: 0 Number of falls in past year: 0 Was there an injury with Fall?: 0 Fall Risk Category Calculator: 0 Patient Fall Risk Level: Low Fall Risk  Fall Risk Patient at Risk for Falls Due to: No Fall Risks Fall risk Follow up: Falls evaluation completed; Education provided; Falls prevention discussed  Home and Transportation Safety: All rugs have non-skid backing?: yes All stairs or steps have railings?: yes Grab bars in the bathtub or shower?: yes Have non-skid surface in bathtub or shower?: yes Good home lighting?: yes Regular seat belt use?: yes Hospital stays in the last year:: no  Cognitive Assessment Difficulty concentrating, remembering, or making decisions? : no Will 6CIT  or Mini Cog be Completed: no 6CIT or Mini Cog Declined: patient alert, oriented, able to answer questions appropriately and recall recent events  Advance Directives (For Healthcare) Does Patient Have a Medical Advance Directive?: No Would patient like information on creating a medical advance directive?: No - Patient declined  Reviewed/Updated  Reviewed/Updated: Reviewed All (Medical, Surgical, Family, Medications, Allergies, Care Teams, Patient Goals)        Objective:    Today's Vitals   05/14/24 1111  Weight: 140 lb (63.5 kg)  Height: 5' 7 (1.702 m)   Body mass index is 21.93 kg/m.  Current  Medications (verified) Outpatient Encounter Medications as of 05/14/2024  Medication Sig   aspirin 81 MG tablet Take 81 mg by mouth daily.   fluticasone  (FLONASE ) 50 MCG/ACT nasal spray Place 2 sprays into both nostrils daily.   hydrochlorothiazide  (HYDRODIURIL ) 25 MG tablet TAKE 1 TABLET (25 MG TOTAL) BY MOUTH DAILY.   lisinopril  (ZESTRIL ) 10 MG tablet TAKE 1 TABLET (10 MG TOTAL) BY MOUTH DAILY.   rosuvastatin  (CRESTOR ) 40 MG tablet Take 1 tablet (40 mg total) by mouth daily.   No facility-administered encounter medications on file as of 05/14/2024.   Hearing/Vision screen Hearing Screening - Comments:: Patient denies any hearing difficulties.   Vision Screening - Comments:: Wears rx glasses - up to date with routine eye exams with  [facility] Immunizations and Health Maintenance Health Maintenance  Topic Date Due   DTaP/Tdap/Td (1 - Tdap) Never done   Lung Cancer Screening  Never done   Zoster Vaccines- Shingrix (1 of 2) Never done   COVID-19 Vaccine (3 - 2025-26 season) 02/13/2024   Medicare Annual Wellness (AWV)  05/10/2024   Pneumococcal Vaccine: 50+ Years  Completed   Influenza Vaccine  Completed   Hepatitis C Screening  Completed   Meningococcal B Vaccine  Aged Out        Assessment/Plan:  This is a routine wellness examination for [name].  Patient Care Team: Tobie Suzzane POUR, MD as PCP - General (Internal Medicine) Alvan Dorn FALCON, MD as PCP - Cardiology (Cardiology) Alvan Dorn FALCON, MD as Consulting Physician (Cardiology) [facility], [facility])  I have personally reviewed and noted the following in the patient's chart:   Medical and social history Use of alcohol, tobacco or illicit drugs  Current medications and supplements including opioid prescriptions. Functional ability and status Nutritional status Physical activity Advanced directives List of other physicians Hospitalizations, surgeries, and ER visits in  previous 12 months Vitals Screenings to include cognitive, depression, and falls Referrals and appointments  No orders of the defined types were placed in this encounter.  In addition, I have reviewed and discussed with patient certain preventive protocols, quality metrics, and best practice recommendations. A written personalized care plan for preventive services as well as general preventive health recommendations were provided to patient.   Kaely Hollan, CMA   05/14/2024   No follow-ups on file.  After Visit Summary: (Mail) Due to this being a telephonic visit, the after visit summary with patients personalized plan was offered to patient via mail   Nurse Notes: declined colonoscopy and lung ca screen

## 2024-05-14 NOTE — Patient Instructions (Signed)
 [name],  Thank you for taking the time for your Medicare Wellness Visit. I appreciate your continued commitment to your health goals. Please review the care plan we discussed, and feel free to reach out if I can assist you further.  Please note that Annual Wellness Visits do not include a physical exam. Some assessments may be limited, especially if the visit was conducted virtually. If needed, we may recommend an in-person follow-up with your provider.  Ongoing Care Seeing your primary care provider every 3 to 6 months helps us  monitor your health and provide consistent, personalized care.   1 year Medicare Wellness Follow up with Wellness Nurse: May 15, 2025 at 1:10 pm in office  Referrals If a referral was made during today's visit and you haven't received any updates within two weeks, please contact the referred provider directly to check on the status.   Recommended Screenings:  Health Maintenance  Topic Date Due   DTaP/Tdap/Td vaccine (1 - Tdap) Never done   Screening for Lung Cancer  Never done   Zoster (Shingles) Vaccine (1 of 2) Never done   COVID-19 Vaccine (3 - 2025-26 season) 02/13/2024   Medicare Annual Wellness Visit  05/14/2025   Pneumococcal Vaccine for age over 24  Completed   Flu Shot  Completed   Hepatitis C Screening  Completed   Meningitis B Vaccine  Aged Out       05/14/2024   11:12 AM  Advanced Directives  Does Patient Have a Medical Advance Directive? No  Would patient like information on creating a medical advance directive? No - Patient declined    Vision: Annual vision screenings are recommended for early detection of glaucoma, cataracts, and diabetic retinopathy. These exams can also reveal signs of chronic conditions such as diabetes and high blood pressure.  Dental: Annual dental screenings help detect early signs of oral cancer, gum disease, and other conditions linked to overall health, including heart disease and diabetes.  Please see  the attached documents for additional preventive care recommendations.

## 2024-06-17 ENCOUNTER — Other Ambulatory Visit: Payer: Self-pay | Admitting: Cardiology

## 2024-06-17 DIAGNOSIS — Z9109 Other allergy status, other than to drugs and biological substances: Secondary | ICD-10-CM

## 2024-09-03 ENCOUNTER — Ambulatory Visit: Admitting: Internal Medicine

## 2025-05-15 ENCOUNTER — Ambulatory Visit
